# Patient Record
Sex: Female | Born: 1940 | Race: White | Hispanic: No | Smoking: Former smoker
Health system: Southern US, Community
[De-identification: ages and names within clinical notes are randomized; demographics above are authoritative.]

## PROBLEM LIST (undated history)

## (undated) DIAGNOSIS — K219 Gastro-esophageal reflux disease without esophagitis: Secondary | ICD-10-CM

## (undated) DIAGNOSIS — E039 Hypothyroidism, unspecified: Secondary | ICD-10-CM

## (undated) DIAGNOSIS — I10 Essential (primary) hypertension: Secondary | ICD-10-CM

## (undated) DIAGNOSIS — I509 Heart failure, unspecified: Secondary | ICD-10-CM

## (undated) HISTORY — PX: INTRACAPSULAR CATARACT EXTRACTION: SHX361

## (undated) HISTORY — PX: BACK SURGERY: SHX140

## (undated) HISTORY — PX: TUBAL LIGATION: SHX77

## (undated) HISTORY — DX: Heart failure, unspecified: I50.9

---

## 2002-03-24 ENCOUNTER — Encounter: Payer: Self-pay | Admitting: *Deleted

## 2002-03-24 ENCOUNTER — Ambulatory Visit (HOSPITAL_COMMUNITY): Admission: RE | Admit: 2002-03-24 | Discharge: 2002-03-24 | Payer: Self-pay | Admitting: *Deleted

## 2002-06-11 ENCOUNTER — Encounter: Payer: Self-pay | Admitting: Emergency Medicine

## 2002-06-11 ENCOUNTER — Encounter: Payer: Self-pay | Admitting: Internal Medicine

## 2002-06-11 ENCOUNTER — Inpatient Hospital Stay (HOSPITAL_COMMUNITY): Admission: EM | Admit: 2002-06-11 | Discharge: 2002-06-11 | Payer: Self-pay | Admitting: Internal Medicine

## 2002-08-10 ENCOUNTER — Encounter: Payer: Self-pay | Admitting: Family Medicine

## 2002-08-10 ENCOUNTER — Ambulatory Visit: Admission: RE | Admit: 2002-08-10 | Discharge: 2002-08-10 | Payer: Self-pay | Admitting: Family Medicine

## 2002-08-14 ENCOUNTER — Encounter: Payer: Self-pay | Admitting: Family Medicine

## 2002-08-14 ENCOUNTER — Ambulatory Visit (HOSPITAL_COMMUNITY): Admission: RE | Admit: 2002-08-14 | Discharge: 2002-08-14 | Payer: Self-pay | Admitting: Family Medicine

## 2003-09-01 ENCOUNTER — Other Ambulatory Visit: Admission: RE | Admit: 2003-09-01 | Discharge: 2003-09-01 | Payer: Self-pay | Admitting: Family Medicine

## 2003-09-20 ENCOUNTER — Ambulatory Visit (HOSPITAL_COMMUNITY): Admission: RE | Admit: 2003-09-20 | Discharge: 2003-09-20 | Payer: Self-pay | Admitting: Family Medicine

## 2004-05-26 ENCOUNTER — Ambulatory Visit (HOSPITAL_COMMUNITY): Admission: RE | Admit: 2004-05-26 | Discharge: 2004-05-26 | Payer: Self-pay | Admitting: Family Medicine

## 2004-09-20 ENCOUNTER — Ambulatory Visit (HOSPITAL_COMMUNITY): Admission: RE | Admit: 2004-09-20 | Discharge: 2004-09-20 | Payer: Self-pay | Admitting: Family Medicine

## 2004-10-05 ENCOUNTER — Other Ambulatory Visit: Admission: RE | Admit: 2004-10-05 | Discharge: 2004-10-05 | Payer: Self-pay | Admitting: Family Medicine

## 2004-10-24 ENCOUNTER — Ambulatory Visit (HOSPITAL_COMMUNITY): Admission: RE | Admit: 2004-10-24 | Discharge: 2004-10-24 | Payer: Self-pay | Admitting: Orthopedic Surgery

## 2005-09-25 ENCOUNTER — Ambulatory Visit (HOSPITAL_COMMUNITY): Admission: RE | Admit: 2005-09-25 | Discharge: 2005-09-25 | Payer: Self-pay | Admitting: Family Medicine

## 2005-10-10 ENCOUNTER — Other Ambulatory Visit: Admission: RE | Admit: 2005-10-10 | Discharge: 2005-10-10 | Payer: Self-pay | Admitting: Family Medicine

## 2006-12-10 ENCOUNTER — Ambulatory Visit (HOSPITAL_COMMUNITY): Admission: RE | Admit: 2006-12-10 | Discharge: 2006-12-10 | Payer: Self-pay | Admitting: Family Medicine

## 2006-12-11 ENCOUNTER — Other Ambulatory Visit: Admission: RE | Admit: 2006-12-11 | Discharge: 2006-12-11 | Payer: Self-pay | Admitting: Family Medicine

## 2008-06-29 ENCOUNTER — Ambulatory Visit (HOSPITAL_COMMUNITY): Admission: RE | Admit: 2008-06-29 | Discharge: 2008-06-29 | Payer: Self-pay | Admitting: Family Medicine

## 2008-07-20 ENCOUNTER — Other Ambulatory Visit: Admission: RE | Admit: 2008-07-20 | Discharge: 2008-07-20 | Payer: Self-pay | Admitting: Family Medicine

## 2010-05-19 NOTE — Discharge Summary (Signed)
Tabitha Wade, Tabitha NO.:  [id number]   MEDICAL RECORD NO.:  [id number]                   PATIENT TYPE:  INP   LOCATION:  2103                                 FACILITY:  [facility]   PHYSICIAN:  Deirdre Peer. Polite, M.D.              DATE OF BIRTH:  1940/11/25   DATE OF ADMISSION:  06/11/2002  DATE OF DISCHARGE:                                 DISCHARGE SUMMARY   DISCHARGE DIAGNOSES:  1. Sepsis, presumed secondary to urosepsis, but cannot exclude intra-     abdominal process versus osteo, apparently stable on intravenously     antibiotics, Cipro. No pressors at this time.  2. Critical anemia, status post transfusion. Admission hemoglobin 7.5,     discharge hemoglobin 10.0, status post two units of packed red blood     cells.  3. Postoperative hematomas left pelvis as well as posterior to the spine     with scattered calcified densities, most likely secondary to prior     surgery and ongoing Coumadin therapy for deep venous thrombosis     prophylaxis.  INR 2.1 currently being reversed with FFE and vitamin K.  4. Coagulopathy secondary to Coumadin therapy.  5. History of scoliosis, status post surgery, March of 2004 with repeat     surgery in June of 2004.  6. History of hepatitis.  7. History of rheumatoid arthritis.  8. History of gastroesophageal reflux disease and peptic ulcer disease.   DISCHARGE MEDICATIONS:  1. Cipro 400 mg IV q.12h.  2. Flagyl 500 mg IV q.8h.  3. Vancomycin 1 gram IV q.12h.  4. Protonix 40 mg IV daily.  5. OxyContin 20 mg q.12h.  6. Morphine sulfate 2 to 6 mg IV q.3-4h.  7. Pravachol 400 mg daily.  8. Neurontin 300 mg t.i.d.  9. Phenergan p.r.n.   STUDIES:  Admission hemoglobin 7.4, follow-up hemoglobin post transfusion  10.0.  CBC on admission also significant for white count of 21.9, 90%  neutrophils, platelets of 345, MCV 93.5.  PT 21.3, INR 2.1, PTT 29.  BMET;  sodium 133, potassium 3.4, chloride 99, CO2 31, BUN 3,  creatinine 0.8, AST  138, ALT 51.  UA; specific gravity 1.014, trace ketones, large blood,  nitrite negative, leukocyte positive, many bacteria.   Chest x-ray; no active disease.  CT of abdomen and pelvis; significant for  small bilateral pleural effusions with bibasilar atelectasis. Scoliosis rods  were noted.  There is a large area of soft tissue with posterior fat  measuring 13 cm in maximum transverse diameter and 5 mg in AP diameter.  Several gas bubbles within the soft tissue. Small bony fragments seen  extending throughout the soft tissue.  Liver and spleen were normal.  Kidneys unremarkable.  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  The pelvis revealed a large area of abnormal soft tissue in the left pelvis  compatible with a hematoma, hematoma cannot be excluded.  DISPOSITION:  The patient is being transferred to [facility] for further  follow-up and treatment to rule out possible infection in the spinal,  paraspinal area.   HISTORY OF PRESENT ILLNESS:  A 70 year old white female who recently was  discharged from [facility] after undergoing surgical procedure on her  spine, where she had an anterior diskectomy at the L4-5 and L5-S1 with  anterior fusion.  She also had posterior removal of segmental  instrumentation, posterior extension fusion of L3 to S1. The patient was  discharged on June 6, in stable condition, but since being home had poor  p.o. intake, nausea, abdominal pain, and significant back pain.  Prior to  her presenting to [facility] emergency department, the  patient had significant fever as well.  On presentation to the ED, the  patient was found to be febrile, temperature was 104, hypotensive with blood  pressure of 90/60, pulse 84, and respiratory rate of 12.  Labs revealed  abnormal UA consistent with UTI, elevated WBC count of 21.9, BMET within  normal limits.  Admission was deemed necessary for further evaluation and   treatment for possible sepsis.  Of note also, the patient has described  decreased ability to void since being at home with insertion of a Foley in  the ED, dark cloudy urine return.   PAST MEDICAL HISTORY:  As stated in the problem list.   MEDICATIONS:  As stated in the medication list.   SOCIAL HISTORY:  Negative for tobacco.   ALLERGIES:  IODINE which causes rash.  PENICILLIN.   HOSPITAL COURSE:  As stated, the patient was admitted to [facility] in septic condition, febrile and hypotensive. She was  resuscitated with aggressive IV fluids and given IV antibiotics in the form  of Cipro and Vancomycin. As stated, the patient was pan cultured. To date,  urine culture is significant for a UTI. Blood cultures are pending at this  time.  After repeat evaluation by myself, the patient was much more stable  hemodynamically and with decreasing temperature. Abdominal examination was  distended, very tender particularly in the left quadrant. As the patient  also had significant anemia on admission, it was felt that blood loss needed  to be ruled out.  CT scan was ordered which revealed large hematomas in the  left pelvis and posterior spine. The patient was subsequently transferred to  [facility] ICU as there were no ICU beds at [facility]. Surgery was consulted and it was recommended to have the patient's  surgeons at [facility] provide care for her as she was recently  discharged from the hospital.  General surgery is also called for backup  just in case the patient had problems with her hematomas.  Other stat lines  were ordered, ie, coags which revealed an INR of 2.1, most likely suggestive  of her Coumadin intake versus coagulopathy of sepsis.  At this time, the  patient is being prepared for transfer to [facility] after speaking with associates of Dr. Dois Davenport.  At this time, it is felt that she most likely is  uroseptic, but it is  impossible at this time to totally exclude infectious  process in her spine versus an infected hematoma.  We will continue broad  spectrum antibiotics and facilitate transfer to [facility] as well as  correction of coagulopathy.  Deirdre Peer. Polite, M.D.    RDP/MEDQ  D:  06/11/2002  T:  06/11/2002  Job:  [id number]   cc:   Dr. Dois Davenport, [facility] Fax [phone]

## 2011-06-26 ENCOUNTER — Other Ambulatory Visit (HOSPITAL_COMMUNITY): Payer: Self-pay | Admitting: Family Medicine

## 2011-06-26 DIAGNOSIS — Z1231 Encounter for screening mammogram for malignant neoplasm of breast: Secondary | ICD-10-CM

## 2011-07-19 ENCOUNTER — Ambulatory Visit (HOSPITAL_COMMUNITY)
Admission: RE | Admit: 2011-07-19 | Discharge: 2011-07-19 | Disposition: A | Discharge status: Home / Self Care | Payer: Medicare Other | Source: Ambulatory Visit | Attending: Family Medicine | Admitting: Family Medicine

## 2011-07-19 DIAGNOSIS — Z1231 Encounter for screening mammogram for malignant neoplasm of breast: Secondary | ICD-10-CM | POA: Insufficient documentation

## 2011-08-01 ENCOUNTER — Other Ambulatory Visit (HOSPITAL_COMMUNITY)
Admission: RE | Admit: 2011-08-01 | Discharge: 2011-08-01 | Disposition: A | Discharge status: Home / Self Care | Payer: Medicare Other | Source: Ambulatory Visit | Attending: Family Medicine | Admitting: Family Medicine

## 2011-08-01 ENCOUNTER — Other Ambulatory Visit: Payer: Self-pay | Admitting: Family Medicine

## 2011-08-01 DIAGNOSIS — Z124 Encounter for screening for malignant neoplasm of cervix: Secondary | ICD-10-CM | POA: Insufficient documentation

## 2011-08-07 ENCOUNTER — Other Ambulatory Visit: Payer: Self-pay | Admitting: Family Medicine

## 2011-08-07 ENCOUNTER — Other Ambulatory Visit (HOSPITAL_COMMUNITY): Payer: Self-pay | Admitting: Family Medicine

## 2011-08-07 DIAGNOSIS — R918 Other nonspecific abnormal finding of lung field: Secondary | ICD-10-CM

## 2011-08-07 DIAGNOSIS — M810 Age-related osteoporosis without current pathological fracture: Secondary | ICD-10-CM

## 2011-08-08 ENCOUNTER — Other Ambulatory Visit: Payer: Medicare Other

## 2011-08-09 ENCOUNTER — Ambulatory Visit
Admission: RE | Admit: 2011-08-09 | Discharge: 2011-08-09 | Disposition: A | Discharge status: Home / Self Care | Payer: Medicare Other | Source: Ambulatory Visit | Attending: Family Medicine | Admitting: Family Medicine

## 2011-08-09 DIAGNOSIS — R918 Other nonspecific abnormal finding of lung field: Secondary | ICD-10-CM

## 2011-08-09 MED ORDER — IOHEXOL 300 MG/ML  SOLN: 75.0000 mL | Freq: Once | INTRAMUSCULAR | Status: AC | PRN

## 2011-08-13 ENCOUNTER — Ambulatory Visit (HOSPITAL_COMMUNITY)
Admission: RE | Admit: 2011-08-13 | Discharge: 2011-08-13 | Disposition: A | Discharge status: Home / Self Care | Payer: Medicare Other | Source: Ambulatory Visit | Attending: Family Medicine | Admitting: Family Medicine

## 2011-08-13 DIAGNOSIS — Z780 Asymptomatic menopausal state: Secondary | ICD-10-CM | POA: Insufficient documentation

## 2011-08-13 DIAGNOSIS — Z13820 Encounter for screening for osteoporosis: Secondary | ICD-10-CM | POA: Insufficient documentation

## 2011-08-13 DIAGNOSIS — M810 Age-related osteoporosis without current pathological fracture: Secondary | ICD-10-CM

## 2012-03-24 ENCOUNTER — Other Ambulatory Visit: Payer: Self-pay | Admitting: Gastroenterology

## 2013-04-04 ENCOUNTER — Encounter: Payer: Self-pay | Admitting: *Deleted

## 2015-07-20 ENCOUNTER — Ambulatory Visit: Payer: No Typology Code available for payment source | Admitting: Cardiology

## 2015-08-29 ENCOUNTER — Ambulatory Visit (INDEPENDENT_AMBULATORY_CARE_PROVIDER_SITE_OTHER): Payer: Medicare Other | Admitting: Physician Assistant

## 2015-08-29 ENCOUNTER — Encounter: Payer: Self-pay | Admitting: Physician Assistant

## 2015-08-29 VITALS — BP 164/60 | HR 85 | Ht 63.0 in | Wt 126.6 lb

## 2015-08-29 DIAGNOSIS — E785 Hyperlipidemia, unspecified: Secondary | ICD-10-CM

## 2015-08-29 DIAGNOSIS — I10 Essential (primary) hypertension: Secondary | ICD-10-CM

## 2015-08-29 DIAGNOSIS — R011 Cardiac murmur, unspecified: Secondary | ICD-10-CM

## 2015-08-29 DIAGNOSIS — I6523 Occlusion and stenosis of bilateral carotid arteries: Secondary | ICD-10-CM

## 2015-08-29 NOTE — Patient Instructions (Signed)
Medication Instructions:  Continue current medications  Labwork: None Ordered  Testing/Procedures: Your physician has requested that you have an echocardiogram. Echocardiography is a painless test that uses sound waves to create images of your heart. It provides your doctor with information about the size and shape of your heart and how well your heart's chambers and valves are working. This procedure takes approximately one hour. There are no restrictions for this procedure.  Your physician has requested that you have a carotid duplex. This test is an ultrasound of the carotid arteries in your neck. It looks at blood flow through these arteries that supply the brain with blood. Allow one hour for this exam. There are no restrictions or special instructions.  Follow-Up: Your physician wants you to follow-up in: 1 Year with Dr Mayford Knife. You will receive a reminder letter in the mail two months in advance. If you don't receive a letter, please call our office to schedule the follow-up appointment.   Any Other Special Instructions Will Be Listed Below (If Applicable).   If you need a refill on your cardiac medications before your next appointment, please call your pharmacy.

## 2015-08-29 NOTE — Progress Notes (Signed)
Cardiology Office Note    Date:  08/29/2015   ID:  Wade, Tabitha 19-Sep-1940, MRN [id number]  PCP:  Tabitha Blamer, MD  Cardiologist: Dr. Mayford Knife  Chief Complaint  Patient presents with  . Follow-up    History of Present Illness:  Tabitha Wade is a 75 y.o. female with history of CHF seen by Dr. Mayford Knife in the remote past. She also has chronic back pain, hypertension, hyperlipidemia, hypothyroidism and GE reflux. She is referred to [place] today by Dr. Tiburcio Wade because of recent lower extremity edema. This occurred after a long flight from [place] where she lives part of the year. She was started on Lasix 20 mg daily.  Patient last saw Dr. Mayford Knife in 2013. She's had a nuclear stress test in the past that showed a very small anterior apical defect most likely breast attenuation 04/2010 for atypical chest pain. She also had palpitations and history of PACs and PVCs. History of carotid bruit with Doppler in 2012 showing 50-70% bilateral carotid stenosis.  Comes in today for f/u. Worried about her carotids that haven't been checked. BP up today but patient thinks it's from a headache she's had for the past 3 days. Very active, still drives, does her own housework, gardening, works at her church in [place]. Here to visit her daughter for a few months but primarily lives in [place] with her son. Had cataract surgery and lens transplant since she's been here in May. Denies chest pain, palpitations, dyspnea, dyspnea on exertion, dizziness or presyncope.  Past Medical History:  Diagnosis Date  . CHF (congestive heart failure) (HCC)     Past Surgical History:  Procedure Laterality Date  . BACK SURGERY     3 back surgery, 2004, 2006  . INTRACAPSULAR CATARACT EXTRACTION      Current Medications: Outpatient Medications Prior to Visit  Medication Sig Dispense Refill  . amLODipine (NORVASC) 5 MG tablet Take 5 mg by mouth daily.    Marland Kitchen aspirin EC 81 MG tablet Take 81 mg by mouth daily.    Marland Kitchen CALCIUM  PO Take 500 mg by mouth 3 (three) times daily.    . ergocalciferol (VITAMIN D2) 50000 units capsule Take 50,000 Units by mouth 2 (two) times a week.    . gabapentin (NEURONTIN) 300 MG capsule Take 300 mg by mouth 4 (four) times daily.    Marland Kitchen leflunomide (ARAVA) 10 MG tablet Take 10 mg by mouth daily.    Marland Kitchen levothyroxine (SYNTHROID, LEVOTHROID) 25 MCG tablet Take 25 mcg by mouth daily before breakfast.    . meloxicam (MOBIC) 7.5 MG tablet Take 7.5 mg by mouth daily.    . MORPHINE SULFATE PO Take 15 mg by mouth every 12 (twelve) hours as needed for severe pain (for pain).    . Multiple Vitamin (MULTI-VITAMIN PO) Take 1 tablet by mouth daily.    . ondansetron (ZOFRAN) 8 MG tablet Take 8 mg by mouth every 8 (eight) hours as needed for nausea or vomiting.    . pantoprazole (PROTONIX) 40 MG tablet Take 40 mg by mouth daily.    . Polyethylene Glycol 3350 (MIRALAX PO) Take 1 Container by mouth as needed (for constipation).    . predniSONE (DELTASONE) 5 MG tablet Take 0.5 tablet by mouth every other day    . rosuvastatin (CRESTOR) 20 MG tablet Take 20 mg by mouth daily.    Marland Kitchen sulfaSALAzine (AZULFIDINE) 500 MG tablet Take 500 mg by mouth every 6 (six) hours.    . traMADol Janean Sark)  50 MG tablet Take 50 mg by mouth every 6 (six) hours as needed (for pain).    . triamcinolone cream (KENALOG) 0.1 % Apply 1 application topically 2 (two) times daily. Take as needed at the sign of breakout    . ibandronate (BONIVA) 150 MG tablet Take 150 mg by mouth every 30 (thirty) days. Take in the morning with a full glass of water, on an empty stomach, and do not take anything else by mouth or lie down for the next 30 min.    Marland Kitchen METHOTREXATE SODIUM IJ Inject 25 mg into the vein once a week.     No facility-administered medications prior to visit.      Allergies:   Contrast media [iodinated diagnostic agents]; Cephalexin; Metoprolol tartrate; Neoporacin [bacitracin-neomycin-polymyxin]; Omnicef [cefdinir]; and Penicillins    Social History   Social History  . Marital status: Widowed    Spouse name: [name]  . Number of children: [name]  . Years of education: [name]   Social History Main Topics  . Smoking status: Former Smoker    Quit date: 08/29/1958  . Smokeless tobacco: Former Neurosurgeon  . Alcohol use No  . Drug use: No  . Sexual activity: Not Asked   Other Topics Concern  . None   Social History Narrative  . None     Family History:  The patient's   family history includes Heart attack in her father and mother; Heart disease in her sister and sister; Stroke in her sister.   ROS:   Please see the history of present illness.    Review of Systems  Constitution: Negative.  HENT: Negative.   Eyes: Negative.   Cardiovascular: Negative.   Respiratory: Negative.   Hematologic/Lymphatic: Negative.   Musculoskeletal: Positive for stiffness. Negative for joint pain.  Gastrointestinal: Negative.   Genitourinary: Negative.   Neurological: Negative.    All other systems reviewed and are negative.   PHYSICAL EXAM:   VS:  BP (!) 164/60   Pulse 85   Ht 5\' 3"  (1.6 m)   Wt 126 lb 9.6 oz (57.4 kg)   SpO2 95%   BMI 22.43 kg/m   Physical Exam  GEN: Well nourished, well developed, in no acute distress  Neck: Bilateral carotid bruits no JVD,  or masses Cardiac:RRR; 2/6 systolic murmur at the left sternal border and apex, no rubs, or gallops  Respiratory:  clear to auscultation bilaterally, normal work of breathing GI: soft, nontender, nondistended, + BS Ext: without cyanosis, clubbing, or edema, Good distal pulses bilaterally MS: no deformity or atrophy  Skin: warm and dry, no rash Psych: euthymic mood, full affect  Wt Readings from Last 3 Encounters:  08/29/15 126 lb 9.6 oz (57.4 kg)      Studies/Labs Reviewed:   EKG:  EKG is  ordered today.  The ekg ordered today demonstrates  Normal sinus rhythm, normal EKG  Recent Labs: No results found for requested labs within last 8760 hours.   Lipid  Panel No results found for: CHOL, TRIG, HDL, CHOLHDL, VLDL, LDLCALC, LDLDIRECT  Additional studies/ records that were reviewed today include:  Records reviewed from Dr. Johnathan Hausen office please see above dictation they will be scanned into the chart.    ASSESSMENT:    1. Murmur   2. Carotid stenosis, bilateral   3. Essential hypertension   4. Hyperlipemia      PLAN:  In order of problems listed above:  Systolic murmur and history of diastolic heart failure check 2-D echo. Follow-up  with Dr. Mayford Knife in one year or sooner depending on test results.  Carotid stenosis 50-70% in 2012 hasn't had carotid Dopplers checked since then. We'll check  Essential hypertension blood pressure elevated today. Patient usually checks it at home and it's stable. She's had a headache the past few days and thinks this is why it was high. She also had salty food last night. She will check her blood pressures at home and call us if they remain elevated.  Hyperlipidemia managed by primary care in [place] on Crestor    Medication Adjustments/Labs and Tests Ordered: Current medicines are reviewed at length with the patient today.  Concerns regarding medicines are outlined above.  Medication changes, Labs and Tests ordered today are listed in the Patient Instructions below. Patient Instructions  Medication Instructions:  Continue current medications  Labwork: None Ordered  Testing/Procedures: Your physician has requested that you have an echocardiogram. Echocardiography is a painless test that uses sound waves to create images of your heart. It provides your doctor with information about the size and shape of your heart and how well your heart's chambers and valves are working. This procedure takes approximately one hour. There are no restrictions for this procedure.  Your physician has requested that you have a carotid duplex. This test is an ultrasound of the carotid arteries in your neck. It looks at  blood flow through these arteries that supply the brain with blood. Allow one hour for this exam. There are no restrictions or special instructions.  Follow-Up: Your physician wants you to follow-up in: 1 Year with Dr Mayford Knife. You will receive a reminder letter in the mail two months in advance. If you don't receive a letter, please call our office to schedule the follow-up appointment.   Any Other Special Instructions Will Be Listed Below (If Applicable).   If you need a refill on your cardiac medications before your next appointment, please call your pharmacy.      Elson Clan, PA-C  08/29/2015 12:55 PM    [facility] [address] [place], [place], [place]  [address] Phone: [phone]; Fax: [phone]

## 2015-09-13 ENCOUNTER — Other Ambulatory Visit: Payer: Self-pay

## 2015-09-13 ENCOUNTER — Ambulatory Visit (HOSPITAL_COMMUNITY)
Admission: RE | Admit: 2015-09-13 | Discharge: 2015-09-13 | Disposition: A | Discharge status: Home / Self Care | Payer: Medicare Other | Source: Ambulatory Visit | Attending: Cardiovascular Disease | Admitting: Cardiovascular Disease

## 2015-09-13 ENCOUNTER — Ambulatory Visit (HOSPITAL_BASED_OUTPATIENT_CLINIC_OR_DEPARTMENT_OTHER): Payer: Medicare Other

## 2015-09-13 DIAGNOSIS — I509 Heart failure, unspecified: Secondary | ICD-10-CM | POA: Diagnosis not present

## 2015-09-13 DIAGNOSIS — R011 Cardiac murmur, unspecified: Secondary | ICD-10-CM | POA: Diagnosis not present

## 2015-09-13 DIAGNOSIS — E785 Hyperlipidemia, unspecified: Secondary | ICD-10-CM | POA: Insufficient documentation

## 2015-09-13 DIAGNOSIS — I340 Nonrheumatic mitral (valve) insufficiency: Secondary | ICD-10-CM | POA: Diagnosis not present

## 2015-09-13 DIAGNOSIS — I071 Rheumatic tricuspid insufficiency: Secondary | ICD-10-CM | POA: Insufficient documentation

## 2015-09-13 DIAGNOSIS — Z87891 Personal history of nicotine dependence: Secondary | ICD-10-CM | POA: Insufficient documentation

## 2015-09-13 DIAGNOSIS — I110 Hypertensive heart disease with heart failure: Secondary | ICD-10-CM | POA: Insufficient documentation

## 2015-09-13 DIAGNOSIS — I6523 Occlusion and stenosis of bilateral carotid arteries: Secondary | ICD-10-CM | POA: Insufficient documentation

## 2015-09-22 ENCOUNTER — Telehealth: Payer: Self-pay | Admitting: *Deleted

## 2015-09-22 DIAGNOSIS — Z5181 Encounter for therapeutic drug level monitoring: Secondary | ICD-10-CM

## 2015-09-22 DIAGNOSIS — Z79899 Other long term (current) drug therapy: Principal | ICD-10-CM

## 2015-09-22 MED ORDER — FUROSEMIDE 20 MG PO TABS: 20.0000 mg | ORAL_TABLET | Freq: Every day | ORAL | 1 refills | Status: DC

## 2015-09-22 NOTE — Telephone Encounter (Signed)
Pt aware of her lab echo results and is agreeable with restarting Lasix 20 mg qd and repeat bmet 10/10/15. Order in EPIC. Pt verbalized understanding.

## 2015-09-22 NOTE — Telephone Encounter (Signed)
-----   Message from Dyann Kief, PA-C sent at 09/21/2015  7:50 AM EDT ----- Give Lasix 20 mg once daily. Advise to eat potassium rich foods such as bananas orange juice, broccoli. Should have bmet checked about 2-3 weeks after starting lasix

## 2015-10-10 ENCOUNTER — Other Ambulatory Visit: Payer: Medicare Other | Admitting: *Deleted

## 2015-10-10 DIAGNOSIS — Z5181 Encounter for therapeutic drug level monitoring: Secondary | ICD-10-CM

## 2015-10-10 DIAGNOSIS — Z79899 Other long term (current) drug therapy: Principal | ICD-10-CM

## 2015-10-10 LAB — BASIC METABOLIC PANEL
BUN: 12 mg/dL (ref 7–25)
CO2: 29 mmol/L (ref 20–31)
CREATININE: 0.55 mg/dL — AB (ref 0.60–0.93)
Calcium: 9.1 mg/dL (ref 8.6–10.4)
Chloride: 99 mmol/L (ref 98–110)
Glucose, Bld: 88 mg/dL (ref 65–99)
Potassium: 4.5 mmol/L (ref 3.5–5.3)
Sodium: 138 mmol/L (ref 135–146)

## 2015-10-11 ENCOUNTER — Encounter: Payer: Self-pay | Admitting: Physician Assistant

## 2015-10-11 ENCOUNTER — Ambulatory Visit (INDEPENDENT_AMBULATORY_CARE_PROVIDER_SITE_OTHER): Payer: Medicare Other | Admitting: Physician Assistant

## 2015-10-11 ENCOUNTER — Encounter (INDEPENDENT_AMBULATORY_CARE_PROVIDER_SITE_OTHER): Payer: Self-pay

## 2015-10-11 VITALS — BP 172/80 | HR 80 | Ht 63.0 in | Wt 135.0 lb

## 2015-10-11 DIAGNOSIS — Z5181 Encounter for therapeutic drug level monitoring: Secondary | ICD-10-CM | POA: Diagnosis not present

## 2015-10-11 DIAGNOSIS — R011 Cardiac murmur, unspecified: Secondary | ICD-10-CM

## 2015-10-11 DIAGNOSIS — I6523 Occlusion and stenosis of bilateral carotid arteries: Secondary | ICD-10-CM

## 2015-10-11 DIAGNOSIS — I10 Essential (primary) hypertension: Secondary | ICD-10-CM

## 2015-10-11 DIAGNOSIS — I5032 Chronic diastolic (congestive) heart failure: Secondary | ICD-10-CM

## 2015-10-11 DIAGNOSIS — Z79899 Other long term (current) drug therapy: Secondary | ICD-10-CM

## 2015-10-11 DIAGNOSIS — M1990 Unspecified osteoarthritis, unspecified site: Secondary | ICD-10-CM

## 2015-10-11 MED ORDER — ENALAPRIL MALEATE 5 MG PO TABS: 5.0000 mg | ORAL_TABLET | Freq: Every day | ORAL | 1 refills | Status: DC

## 2015-10-11 NOTE — Progress Notes (Signed)
Cardiology Office Note    Date:  10/11/2015   ID:  Junnie, Loschiavo April 10, 1940, MRN [id number]  PCP:  Johny Blamer, MD  Cardiologist: Dr. Mayford Knife  Chief Complaint  Patient presents with  . Pre-op Exam    History of Present Illness:  Tabitha Wade is a 75 y.o. female  with history of CHF seen by Dr. Mayford Knife in the remote past. She also has chronic back pain, hypertension, hyperlipidemia, hypothyroidism and GE reflux. She is referred to [place] today by Dr. Tiburcio Pea because of recent lower extremity edema. This occurred after a long flight from [place] where she lives part of the year. She was started on Lasix 20 mg daily.   Patient last saw Dr. Mayford Knife in 2013. She's had a nuclear stress test in the past that showed a very small anterior apical defect most likely breast attenuation 04/2010 for atypical chest pain. She also had palpitations and history of PACs and PVCs. History of carotid bruit with Doppler in 2012 showing 50-70% bilateral carotid stenosis.  I saw her in follow-up 08/29/15. Blood pressure was up that day but she thought was due to headache. She primarily lives in [place] with her some but is here visiting her daughter for a few months.   2-D echo showed normal LVEF 65-70% with grade 1 DD and mild to moderately increased PA pressure 45 mmHg. She had trace of MR and TR. I prescribed Lasix 20 mg daily for edema. Carotid Dopplers showed 1-39% right ICA stenosis and 60-79% left ICA stenosis.  Patient comes in today and wanted to review her tests. She also wants to know she should take Lasix every day. Her blood pressure is up once again today. She said she hasn't taken her afternoon pain medication. She hasn't been checking her blood pressure at home but will start.She used to take enalapril for her hypertension and said it was really well-controlled on this medicine. Her primary care doctor decided to switch her to Norvasc so she wouldn't be on the same drug for too many years. She  also has occasional palpitations but not enough to wear a monitor according to her.     Past Medical History:  Diagnosis Date  . CHF (congestive heart failure) (HCC)     Past Surgical History:  Procedure Laterality Date  . BACK SURGERY     3 back surgery, 2004, 2006  . INTRACAPSULAR CATARACT EXTRACTION      Current Medications: Outpatient Medications Prior to Visit  Medication Sig Dispense Refill  . amLODipine (NORVASC) 5 MG tablet Take 5 mg by mouth daily.    Marland Kitchen aspirin EC 81 MG tablet Take 81 mg by mouth daily.    Marland Kitchen CALCIUM PO Take 500 mg by mouth 3 (three) times daily.    . ergocalciferol (VITAMIN D2) 50000 units capsule Take 50,000 Units by mouth 2 (two) times a week.    . furosemide (LASIX) 20 MG tablet Take 1 tablet (20 mg total) by mouth daily. 90 tablet 1  . gabapentin (NEURONTIN) 300 MG capsule Take 300 mg by mouth 4 (four) times daily.    Marland Kitchen ibandronate (BONIVA) 150 MG tablet Take 150 mg by mouth every 30 (thirty) days. Take in the morning with a full glass of water, on an empty stomach, and do not take anything else by mouth or lie down for the next 30 min.    Marland Kitchen leflunomide (ARAVA) 10 MG tablet Take 10 mg by mouth daily.    Marland Kitchen levothyroxine (SYNTHROID,  LEVOTHROID) 25 MCG tablet Take 25 mcg by mouth daily before breakfast.    . meloxicam (MOBIC) 7.5 MG tablet Take 7.5 mg by mouth daily.    Marland Kitchen METHOTREXATE SODIUM IJ Inject 25 mg into the vein once a week.    . MORPHINE SULFATE PO Take 15 mg by mouth every 12 (twelve) hours as needed for severe pain (for pain).    . Multiple Vitamin (MULTI-VITAMIN PO) Take 1 tablet by mouth daily.    . ondansetron (ZOFRAN) 8 MG tablet Take 8 mg by mouth every 8 (eight) hours as needed for nausea or vomiting.    . pantoprazole (PROTONIX) 40 MG tablet Take 40 mg by mouth daily.    . Polyethylene Glycol 3350 (MIRALAX PO) Take 1 Container by mouth as needed (for constipation).    . predniSONE (DELTASONE) 5 MG tablet Take 0.5 tablet by mouth every  other day    . rosuvastatin (CRESTOR) 20 MG tablet Take 20 mg by mouth daily.    Marland Kitchen sulfaSALAzine (AZULFIDINE) 500 MG tablet Take 500 mg by mouth every 6 (six) hours.    . traMADol (ULTRAM) 50 MG tablet Take 50 mg by mouth every 6 (six) hours as needed (for pain).    . triamcinolone cream (KENALOG) 0.1 % Apply 1 application topically 2 (two) times daily. Take as needed at the sign of breakout     No facility-administered medications prior to visit.      Allergies:   Contrast media [iodinated diagnostic agents]; Cephalexin; Metoprolol tartrate; Neoporacin [bacitracin-neomycin-polymyxin]; Omnicef [cefdinir]; and Penicillins   Social History   Social History  . Marital status: Widowed    Spouse name: [name]  . Number of children: [name]  . Years of education: [name]   Social History Main Topics  . Smoking status: Former Smoker    Quit date: 08/29/1958  . Smokeless tobacco: Former Neurosurgeon  . Alcohol use No  . Drug use: No  . Sexual activity: Not Asked   Other Topics Concern  . None   Social History Narrative  . None     Family History:  The patient's family history includes Heart attack in her father and mother; Heart disease in her sister and sister; Stroke in her sister.   ROS:   Please see the history of present illness.    Review of Systems  Constitution: Positive for malaise/fatigue.  HENT: Negative.   Eyes: Negative.   Cardiovascular: Negative.   Respiratory: Negative.   Hematologic/Lymphatic: Bruises/bleeds easily.  Musculoskeletal: Positive for arthritis, back pain and myalgias. Negative for joint pain.       Chronic leg pain  Gastrointestinal: Negative.   Genitourinary: Negative.   Neurological: Negative.    All other systems reviewed and are negative.   PHYSICAL EXAM:   VS:  BP (!) 172/80 (BP Location: Right Arm, Patient Position: Sitting, Cuff Size: Normal)   Pulse 80   Ht 5\' 3"  (1.6 m)   Wt 135 lb (61.2 kg)   BMI 23.91 kg/m   Physical Exam  GEN: Well nourished,  well developed, in no acute distress Neck: no JVD, carotid bruits, or masses Cardiac:RRR; 2/6 systolic murmur at the left sternal border, positive S4, no rubs Respiratory:  clear to auscultation bilaterally, normal work of breathing GI: soft, nontender, nondistended, + BS Ext: without cyanosis, clubbing, or edema, Good distal pulses bilaterally MS: no deformity or atrophy Skin: warm and dry, no rash Psych: euthymic mood, full affect  Wt Readings from Last 3 Encounters:  10/11/15  135 lb (61.2 kg)  08/29/15 126 lb 9.6 oz (57.4 kg)      Studies/Labs Reviewed:   EKG:  EKG isNot ordered today.   Recent Labs: 10/10/2015: BUN 12; Creat 0.55; Potassium 4.5; Sodium 138   Lipid Panel No results found for: CHOL, TRIG, HDL, CHOLHDL, VLDL, LDLCALC, LDLDIRECT  Additional studies/ records that were reviewed today include:  Carotid Dopplers 09/2015 Carotids okay will need follow-up Dopplers in 1 year. 1-39% right ICA stenosis.60-79% left ICA stenosis        2-D echo 09/13/15 Study Conclusions   - Left ventricle: The cavity size was normal. Wall thickness was   normal. Systolic function was vigorous. The estimated ejection   fraction was in the range of 65% to 70%. Wall motion was normal;   there were no regional wall motion abnormalities. Doppler   parameters are consistent with abnormal left ventricular   relaxation (grade 1 diastolic dysfunction). - Pulmonary arteries: Systolic pressure was mildly to moderately   increased. PA peak pressure: 45 mm Hg (S).   Impressions:   - Vigorous LV systolic function; grade 1 diastolic dysfunction;   trace MR and TR; mild to moderate elevation in pulmonary   pressure.    ASSESSMENT:    1. Encounter for monitoring diuretic therapy   2. Chronic diastolic CHF (congestive heart failure) (HCC)   3. Carotid stenosis, bilateral   4. Essential hypertension   5. Murmur      PLAN:  In order of problems listed above:  Encounter for monitoring  diuretic therapy: With elevated blood pressures and pulmonary pressures on echo would recommend continuing Lasix 20 mg daily at this time  Chronic diastolic heart failure compensated  Bilateral carotid stenosis with recent Dopplers listed above. 60-79% LICA stenosis. Will need repeat in one year.  Essential hypertension blood pressure is once again elevated here today. Patient attributes this to pain. Because of her recent echo results and elevated blood pressures she will start checking them daily. Restart enalapril 5 mg once daily. Continue amlodipine at this time. I will see her back in several weeks to see if any further adjustments are needed.  Heart murmur patient's 2-D echo reviewed with her in detail. She only has a trace of MR and TR    Medication Adjustments/Labs and Tests Ordered: Current medicines are reviewed at length with the patient today.  Concerns regarding medicines are outlined above.  Medication changes, Labs and Tests ordered today are listed in the Patient Instructions below. Patient Instructions  Medication Instructions:  Start enalapril 5mg  daily  Labwork: none  Testing/Procedures: None   Follow-Up: Your physician recommends that you schedule a follow-up appointment in: 3 weeks with , PA or Dr Jacolyn Reedy.        If you need a refill on your cardiac medications before your next appointment, please call your pharmacy.      Signed, Mayford Knife, PA-C  10/11/2015 2:56 PM    [facility] [address] [place], [place], [place]  [place] Phone: [phone]; Fax: [phone]

## 2015-10-11 NOTE — Patient Instructions (Signed)
Medication Instructions:  Start enalapril 5mg  daily  Labwork: none  Testing/Procedures: None   Follow-Up: Your physician recommends that you schedule a follow-up appointment in: 3 weeks with , PA or Dr Jacolyn Reedy.        If you need a refill on your cardiac medications before your next appointment, please call your pharmacy.

## 2015-11-01 ENCOUNTER — Ambulatory Visit (INDEPENDENT_AMBULATORY_CARE_PROVIDER_SITE_OTHER): Payer: Medicare Other | Admitting: Physician Assistant

## 2015-11-01 ENCOUNTER — Encounter: Payer: Self-pay | Admitting: Physician Assistant

## 2015-11-01 VITALS — BP 142/56 | HR 83 | Ht 62.5 in | Wt 139.4 lb

## 2015-11-01 DIAGNOSIS — I10 Essential (primary) hypertension: Secondary | ICD-10-CM | POA: Diagnosis not present

## 2015-11-01 DIAGNOSIS — J069 Acute upper respiratory infection, unspecified: Secondary | ICD-10-CM

## 2015-11-01 DIAGNOSIS — R011 Cardiac murmur, unspecified: Secondary | ICD-10-CM

## 2015-11-01 DIAGNOSIS — I6523 Occlusion and stenosis of bilateral carotid arteries: Secondary | ICD-10-CM

## 2015-11-01 DIAGNOSIS — I5032 Chronic diastolic (congestive) heart failure: Secondary | ICD-10-CM | POA: Diagnosis not present

## 2015-11-01 NOTE — Patient Instructions (Addendum)
Medication Instructions:  STOP AMLODIPINE  START ENALAPRIL 5 MG DAILY AS PREVIOUSLY RECOMMENDED  Labwork: NONE  Testing/Procedures: NONE  Follow-Up: Your physician wants you to follow-up in: 1 YEAR WITH DR Mayford Knife You will receive a reminder letter in the mail two months in advance. If you don't receive a letter, please call our office to schedule the follow-up appointment.  Any Other Special Instructions Will Be Listed Below (If Applicable). SEE DR Tiburcio Pea FOR YOUR CONGESTION/COUGH   MONITOR YOUR BLOOD PRESSURES AT HOME AND CALL WITH UPDATE [phone]  If you need a refill on your cardiac medications before your next appointment, please call your pharmacy.

## 2015-11-01 NOTE — Progress Notes (Signed)
Cardiology Office Note    Date:  11/01/2015   ID:  Tabitha, Wade 05/12/1940, MRN [id number]  PCP:  Johny Blamer, MD  Cardiologist: Dr. Mayford Knife  Chief Complaint  Patient presents with  . Follow-up  . Chest Pain    pt states some due to coughing   . Shortness of Breath    no SOB     History of Present Illness:  Tabitha Wade is a 75 y.o. female with history of CHF seen by Dr. Mayford Knife in the remote past. She also has chronic back pain, hypertension, hyperlipidemia, hypothyroidism and GE reflux. She is referred to [place] today by Dr. Tiburcio Pea because of recent lower extremity edema. This occurred after a long flight from [place] where she lives part of the year. She was started on Lasix 20 mg daily.   Patient last saw Dr. Mayford Knife in 2013. She's had a nuclear stress test in the past that showed a very small anterior apical defect most likely breast attenuation 04/2010 for atypical chest pain. She also had palpitations and history of PACs and PVCs. History of carotid bruit with Doppler in 2012 showing 50-70% bilateral carotid stenosis.   I saw her in follow-up 08/29/15. Blood pressure was up that day but she thought was due to headache. She primarily lives in [place] with her some but is here visiting her daughter for a few months.   2-D echo showed normal LVEF 65-70% with grade 1 DD and mild to moderately increased PA pressure 45 mmHg. She had trace of MR and TR. I prescribed Lasix 20 mg daily for edema. Carotid Dopplers showed 1-39% right ICA stenosis and 60-79% left ICA stenosis.   I saw her 10/11/15 to review her tests. Her blood pressure was up again. She claimed it was because she hadn't taken her afternoon pain medications. I restarted enalapril 5 mg once daily, continued Lasix 20 mg daily and amlodipine.  She comes in today with blood pressure readings that she has taken and they are all pretty normal. There were some size 148 systolic but she says it happens when she is under a lot  of stress or in pain. She never did start taking the enalapril. She would like to stop amlodipine and go back on enalapril for blood pressure. I told her this would be fine as long as she keeps a close eye on her blood pressures. She also has a cough with thick yellow sputum production. She hasn't had time to see her primary care but was going to call today. No fever or chills.    Past Medical History:  Diagnosis Date  . CHF (congestive heart failure) (HCC)     Past Surgical History:  Procedure Laterality Date  . BACK SURGERY     3 back surgery, 2004, 2006  . INTRACAPSULAR CATARACT EXTRACTION      Current Medications: Outpatient Medications Prior to Visit  Medication Sig Dispense Refill  . aspirin EC 81 MG tablet Take 81 mg by mouth daily.    Marland Kitchen CALCIUM PO Take 500 mg by mouth 3 (three) times daily.    . enalapril (VASOTEC) 5 MG tablet Take 1 tablet (5 mg total) by mouth daily. 30 tablet 1  . ergocalciferol (VITAMIN D2) 50000 units capsule Take 50,000 Units by mouth 2 (two) times a week.    . furosemide (LASIX) 20 MG tablet Take 1 tablet (20 mg total) by mouth daily. 90 tablet 1  . gabapentin (NEURONTIN) 300 MG capsule Take 300 mg  by mouth 4 (four) times daily.    Marland Kitchen ibandronate (BONIVA) 150 MG tablet Take 150 mg by mouth every 30 (thirty) days. Take in the morning with a full glass of water, on an empty stomach, and do not take anything else by mouth or lie down for the next 30 min.    Marland Kitchen leflunomide (ARAVA) 10 MG tablet Take 10 mg by mouth daily.    Marland Kitchen levothyroxine (SYNTHROID, LEVOTHROID) 25 MCG tablet Take 25 mcg by mouth daily before breakfast.    . meloxicam (MOBIC) 7.5 MG tablet Take 7.5 mg by mouth daily.    Marland Kitchen METHOTREXATE SODIUM IJ Inject 25 mg into the vein once a week.    . MORPHINE SULFATE PO Take 15 mg by mouth every 12 (twelve) hours as needed for severe pain (for pain).    . Multiple Vitamin (MULTI-VITAMIN PO) Take 1 tablet by mouth daily.    . pantoprazole (PROTONIX) 40 MG  tablet Take 40 mg by mouth daily.    . Polyethylene Glycol 3350 (MIRALAX PO) Take 1 Container by mouth as needed (for constipation).    . predniSONE (DELTASONE) 5 MG tablet Take 0.5 tablet by mouth every other day    . rosuvastatin (CRESTOR) 20 MG tablet Take 20 mg by mouth daily.    Marland Kitchen sulfaSALAzine (AZULFIDINE) 500 MG tablet Take 500 mg by mouth every 6 (six) hours.    . traMADol (ULTRAM) 50 MG tablet Take 50 mg by mouth every 6 (six) hours as needed (for pain).    . triamcinolone cream (KENALOG) 0.1 % Apply 1 application topically 2 (two) times daily. Take as needed at the sign of breakout    . amLODipine (NORVASC) 5 MG tablet Take 5 mg by mouth daily.    . ondansetron (ZOFRAN) 8 MG tablet Take 8 mg by mouth every 8 (eight) hours as needed for nausea or vomiting.     No facility-administered medications prior to visit.      Allergies:   Contrast media [iodinated diagnostic agents]; Cephalexin; Metoprolol tartrate; Neoporacin [bacitracin-neomycin-polymyxin]; Omnicef [cefdinir]; and Penicillins   Social History   Social History  . Marital status: Widowed    Spouse name: [name]  . Number of children: [name]  . Years of education: [name]   Social History Main Topics  . Smoking status: Former Smoker    Quit date: 08/29/1958  . Smokeless tobacco: Former Neurosurgeon  . Alcohol use No  . Drug use: No  . Sexual activity: Not Asked   Other Topics Concern  . None   Social History Narrative  . None     Family History:  The patient's family history includes Heart attack in her father and mother; Heart disease in her sister and sister; Stroke in her sister.   ROS:   Please see the history of present illness.    Review of Systems  Constitution: Negative.  HENT: Negative.   Eyes: Negative.   Cardiovascular: Negative.   Respiratory: Positive for cough and sputum production.   Hematologic/Lymphatic: Negative.   Musculoskeletal: Positive for arthritis, back pain, muscle weakness and myalgias. Negative  for joint pain.  Gastrointestinal: Negative.   Genitourinary: Negative.   Neurological: Negative.    All other systems reviewed and are negative.   PHYSICAL EXAM:   VS:  BP (!) 142/56   Pulse 83   Ht 5' 2.5" (1.588 m)   Wt 139 lb 6.4 oz (63.2 kg)   SpO2 97%   BMI 25.09 kg/m   Physical  Exam  GEN: Well nourished, well developed, in no acute distress Neck: no JVD, carotid bruits, or masses Cardiac:RRR; 2/6 systolic murmur at the left sternal border, no rubs, or gallops  Respiratory:  Decreased breath sounds with diffuse wheezing throughout  GI: soft, nontender, nondistended, + BS Ext: without cyanosis, clubbing, or edema, Good distal pulses bilaterally MS: no deformity or atrophy Skin: warm and dry, no rash Psych: euthymic mood, full affect  Wt Readings from Last 3 Encounters:  11/01/15 139 lb 6.4 oz (63.2 kg)  10/11/15 135 lb (61.2 kg)  08/29/15 126 lb 9.6 oz (57.4 kg)      Studies/Labs Reviewed:   EKG:  EKG is ordered today.    Recent Labs: 10/10/2015: BUN 12; Creat 0.55; Potassium 4.5; Sodium 138   Lipid Panel No results found for: CHOL, TRIG, HDL, CHOLHDL, VLDL, LDLCALC, LDLDIRECT  Additional studies/ records that were reviewed today include:  Carotid Dopplers 09/2015    Carotids okay will need follow-up Dopplers in 1 year. 1-39% right ICA stenosis.60-79% left ICA stenosis            2-D echo 09/13/15 Study Conclusions   - Left ventricle: The cavity size was normal. Wall thickness was   normal. Systolic function was vigorous. The estimated ejection   fraction was in the range of 65% to 70%. Wall motion was normal;   there were no regional wall motion abnormalities. Doppler   parameters are consistent with abnormal left ventricular   relaxation (grade 1 diastolic dysfunction). - Pulmonary arteries: Systolic pressure was mildly to moderately   increased. PA peak pressure: 45 mm Hg (S).   Impressions:   - Vigorous LV systolic function; grade 1 diastolic  dysfunction;   trace MR and TR; mild to moderate elevation in pulmonary   pressure.     ASSESSMENT:    1. Essential hypertension   2. Chronic diastolic CHF (congestive heart failure) (HCC)   3. Murmur   4. Upper respiratory tract infection, unspecified type      PLAN:  In order of problems listed above:  Essential hypertension: Patient's blood pressure readings from home actually looked good and she has not started the enalapril. She would like to start this and stopped the amlodipine which I told her was fine as long as she kept a close eye on her blood pressure readings. She is getting ready to go back to her home in [place] and will follow-up there.  Chronic diastolic CHF well compensated  Heart murmur with trace of MR and TR on echo  Upper respiratory tract infection, I suspect is bronchitis. She is already using Mucinex and drinking hot tea. She is calling Dr. Tiburcio Pea today for an appointment.     Medication Adjustments/Labs and Tests Ordered: Current medicines are reviewed at length with the patient today.  Concerns regarding medicines are outlined above.  Medication changes, Labs and Tests ordered today are listed in the Patient Instructions below. Patient Instructions  Medication Instructions:  STOP AMLODIPINE  START ENALAPRIL 5 MG DAILY AS PREVIOUSLY RECOMMENDED  Labwork: NONE  Testing/Procedures: NONE  Follow-Up: Your physician wants you to follow-up in: 1 YEAR WITH DR Mayford Knife You will receive a reminder letter in the mail two months in advance. If you don't receive a letter, please call our office to schedule the follow-up appointment.  Any Other Special Instructions Will Be Listed Below (If Applicable). SEE DR Tiburcio Pea FOR YOUR CONGESTION/COUGH   MONITOR YOUR BLOOD PRESSURES AT HOME AND CALL WITH UPDATE [phone]  If you need a refill on your cardiac medications before your next appointment, please call your pharmacy.     Signed, Jacolyn Reedy, PA-C    11/01/2015 11:14 AM    [facility] [address] [place], [place], [place]  [address] Phone: [phone]; Fax: [phone]

## 2016-10-22 ENCOUNTER — Other Ambulatory Visit: Payer: Self-pay | Admitting: *Deleted

## 2016-10-22 DIAGNOSIS — I6523 Occlusion and stenosis of bilateral carotid arteries: Secondary | ICD-10-CM

## 2017-09-09 ENCOUNTER — Encounter (HOSPITAL_COMMUNITY): Payer: Medicare Other

## 2019-02-11 ENCOUNTER — Encounter: Payer: Self-pay | Admitting: Cardiology

## 2019-05-28 ENCOUNTER — Ambulatory Visit (INDEPENDENT_AMBULATORY_CARE_PROVIDER_SITE_OTHER): Payer: Medicare Other | Admitting: Cardiology

## 2019-05-28 VITALS — BP 180/60 | HR 72 | Ht 62.0 in | Wt 113.6 lb

## 2019-05-28 DIAGNOSIS — R42 Dizziness and giddiness: Secondary | ICD-10-CM

## 2019-05-28 DIAGNOSIS — I6522 Occlusion and stenosis of left carotid artery: Secondary | ICD-10-CM | POA: Diagnosis not present

## 2019-05-28 DIAGNOSIS — I5032 Chronic diastolic (congestive) heart failure: Secondary | ICD-10-CM | POA: Diagnosis not present

## 2019-05-28 DIAGNOSIS — I10 Essential (primary) hypertension: Secondary | ICD-10-CM | POA: Diagnosis not present

## 2019-05-28 MED ORDER — AMLODIPINE BESYLATE 5 MG PO TABS: 5.0000 mg | ORAL_TABLET | Freq: Every day | ORAL | 3 refills | Status: DC

## 2019-05-28 NOTE — Progress Notes (Signed)
Cardiology Office Note:    Date:  05/29/2019   ID:  Tabitha, Wade Aug 11, 1940, MRN [id number]  PCP:  Johny Blamer, MD  Cardiologist:  No primary care provider on file.  Electrophysiologist:  None   Referring MD: Johny Blamer, MD   No chief complaint on file.   History of Present Illness:    Tabitha Wade is a 79 y.o. female with a hx of chronic diastolic heart failure, hypertension, hyperlipidemia, hypothyroidism, GERD who is referred by Dr. Tiburcio Pea for an evaluation of heart failure.  She had a nuclear stress test in 04/2010 for atypical chest pain, which showed small anterior apical defect likely breast attenuation.  She had carotid Dopplers in 2012 that showed 60 to 79% left ICA stenosis.  TTE in 09/2015 showed LVEF 65 to 70%, grade 1 diastolic dysfunction PASP 45.    She reports that she has been in [place] for last several years.  Previously followed with Dr. Mayford Knife.  Patient was on Lasix but has been off it for a few years.  States that she has been having issues with hyponatremia.  She denies any chest pain or dyspnea.  Does report occasional lightheadedness.   Past Medical History:  Diagnosis Date  . CHF (congestive heart failure) (HCC)     Past Surgical History:  Procedure Laterality Date  . BACK SURGERY     3 back surgery, 2004, 2006  . INTRACAPSULAR CATARACT EXTRACTION      Current Medications: Current Meds  Medication Sig  . amLODipine (NORVASC) 5 MG tablet Take 1 tablet (5 mg total) by mouth daily.  Marland Kitchen aspirin EC 81 MG tablet Take 81 mg by mouth daily.  Marland Kitchen CALCIUM PO Take 500 mg by mouth 3 (three) times daily.  . ergocalciferol (VITAMIN D2) 50000 units capsule Take 50,000 Units by mouth 2 (two) times a week.  . gabapentin (NEURONTIN) 300 MG capsule Take 300 mg by mouth 4 (four) times daily.  Marland Kitchen ibandronate (BONIVA) 150 MG tablet Take 150 mg by mouth every 30 (thirty) days. Take in the morning with a full glass of water, on an empty stomach, and do not  take anything else by mouth or lie down for the next 30 min.  Marland Kitchen leflunomide (ARAVA) 10 MG tablet Take 10 mg by mouth daily.  Marland Kitchen levothyroxine (SYNTHROID, LEVOTHROID) 25 MCG tablet Take 25 mcg by mouth daily before breakfast.  . losartan (COZAAR) 100 MG tablet Take 100 mg by mouth daily.  . meloxicam (MOBIC) 7.5 MG tablet Take 7.5 mg by mouth daily.  Marland Kitchen METHOTREXATE SODIUM IJ Inject 25 mg into the vein once a week.  . MORPHINE SULFATE PO Take 15 mg by mouth every 12 (twelve) hours as needed for severe pain (for pain).  . Multiple Vitamin (MULTI-VITAMIN PO) Take 1 tablet by mouth daily.  . pantoprazole (PROTONIX) 40 MG tablet Take 40 mg by mouth daily.  . Polyethylene Glycol 3350 (MIRALAX PO) Take 1 Container by mouth as needed (for constipation).  . predniSONE (DELTASONE) 5 MG tablet Take 0.5 tablet by mouth every other day  . rosuvastatin (CRESTOR) 20 MG tablet Take 20 mg by mouth daily.  Marland Kitchen sulfaSALAzine (AZULFIDINE) 500 MG tablet Take 500 mg by mouth every 6 (six) hours.  . traMADol (ULTRAM) 50 MG tablet Take 50 mg by mouth every 6 (six) hours as needed (for pain).  . triamcinolone cream (KENALOG) 0.1 % Apply 1 application topically 2 (two) times daily. Take as needed at the sign of breakout  . [  DISCONTINUED] amLODipine (NORVASC) 2.5 MG tablet Take 2.5 mg by mouth daily.     Allergies:   Contrast media [iodinated diagnostic agents], Cephalexin, Metoprolol tartrate, Neoporacin [bacitracin-neomycin-polymyxin], Omnicef [cefdinir], and Penicillins   Social History   Socioeconomic History  . Marital status: Widowed    Spouse name: Not on file  . Number of children: Not on file  . Years of education: Not on file  . Highest education level: Not on file  Occupational History  . Not on file  Tobacco Use  . Smoking status: Former Smoker    Quit date: 08/29/1958    Years since quitting: 60.7  . Smokeless tobacco: Former Network engineer and Sexual Activity  . Alcohol use: No  . Drug use: No    . Sexual activity: Not on file  Other Topics Concern  . Not on file  Social History Narrative  . Not on file   Social Determinants of Health   Financial Resource Strain:   . Difficulty of Paying Living Expenses:   Food Insecurity:   . Worried About Charity fundraiser in the Last Year:   . Arboriculturist in the Last Year:   Transportation Needs:   . Film/video editor (Medical):   Marland Kitchen Lack of Transportation (Non-Medical):   Physical Activity:   . Days of Exercise per Week:   . Minutes of Exercise per Session:   Stress:   . Feeling of Stress :   Social Connections:   . Frequency of Communication with Friends and Family:   . Frequency of Social Gatherings with Friends and Family:   . Attends Religious Services:   . Active Member of Clubs or Organizations:   . Attends Archivist Meetings:   Marland Kitchen Marital Status:      Family History: The patient's family history includes Heart attack in her father and mother; Heart disease in her sister and sister; Stroke in her sister.  ROS:   Please see the history of present illness.    All other systems reviewed and are negative.  EKGs/Labs/Other Studies Reviewed:    The following studies were reviewed today:   EKG:  EKG is ordered today.  The ekg ordered today demonstrates sinus rhythm, Q waves in V1/2, rate 72  Recent Labs: No results found for requested labs within last 8760 hours.  Recent Lipid Panel No results found for: CHOL, TRIG, HDL, CHOLHDL, VLDL, LDLCALC, LDLDIRECT  Physical Exam:    VS:  BP (!) 180/60   Pulse 72   Ht 5\' 2"  (1.575 m)   Wt 113 lb 9.6 oz (51.5 kg)   BMI 20.78 kg/m     Wt Readings from Last 3 Encounters:  05/28/19 113 lb 9.6 oz (51.5 kg)  11/01/15 139 lb 6.4 oz (63.2 kg)  10/11/15 135 lb (61.2 kg)     GEN:  in no acute distress HEENT: Normal NECK: No JVD; left carotid bruit CARDIAC:RRR, no murmurs, rubs, gallops RESPIRATORY:  Clear to auscultation without rales, wheezing or  rhonchi  ABDOMEN: Soft, non-tender, non-distended MUSCULOSKELETAL:  No edema SKIN: Warm and dry NEUROLOGIC:  Alert and oriented x 3 PSYCHIATRIC:  Normal affect   ASSESSMENT:    1. Lightheadedness   2. Stenosis of left carotid artery   3. Hypertension, unspecified type   4. Chronic diastolic heart failure (HCC)    PLAN:    Carotid stenosis: 60 to 79% left ICA stenosis on last ultrasound in 2012.  Reports intermittent lightheadedness.  Will  check carotid duplex.  Lightheadedness: Check carotid duplex as above.  Will check TTE to evaluate for structural heart disease.  Chronic diastolic heart failure: Has been off Lasix.  Appears euvolemic on exam.  Will check TTE as above  Hypertension: On losartan 100 mg daily and amlodipine 2.5 mg daily.  Will increase amlodipine to 5 mg daily.  Asked patient to check BP daily for next 2 weeks and call with results  RTC in 1 month   Medication Adjustments/Labs and Tests Ordered: Current medicines are reviewed at length with the patient today.  Concerns regarding medicines are outlined above.  Orders Placed This Encounter  Procedures  . ECHOCARDIOGRAM COMPLETE  . VAS US CAROTID   Meds ordered this encounter  Medications  . amLODipine (NORVASC) 5 MG tablet    Sig: Take 1 tablet (5 mg total) by mouth daily.    Dispense:  30 tablet    Refill:  3    Patient Instructions  Medication Instructions:  INCREASE amlodipine to 5 mg daily  *If you need a refill on your cardiac medications before your next appointment, please call your pharmacy*  Testing/Procedures: Your physician has requested that you have an echocardiogram. Echocardiography is a painless test that uses sound waves to create images of your heart. It provides your doctor with information about the size and shape of your heart and how well your heart's chambers and valves are working. This procedure takes approximately one hour. There are no restrictions for this procedure. This  will be done at our [facility] location:  [address]  Your physician has requested that you have a carotid duplex. This test is an ultrasound of the carotid arteries in your neck. It looks at blood flow through these arteries that supply the brain with blood. Allow one hour for this exam. There are no restrictions or special instructions.   Follow-Up: At [facility], you and your health needs are our priority.  As part of our continuing mission to provide you with exceptional heart care, we have created designated Provider Care Teams.  These Care Teams include your primary Cardiologist (physician) and Advanced Practice Providers (APPs -  Physician Assistants and Nurse Practitioners) who all work together to provide you with the care you need, when you need it.  We recommend signing up for the patient portal called "MyChart".  Sign up information is provided on this After Visit Summary.  MyChart is used to connect with patients for Virtual Visits (Telemedicine).  Patients are able to view lab/test results, encounter notes, upcoming appointments, etc.  Non-urgent messages can be sent to your provider as well.   To learn more about what you can do with MyChart, go to [email or web address].    Your next appointment:   1 month(s)  The format for your next appointment:   In Person  Provider:   Epifanio Lesches, MD         Signed, Little Ishikawa, MD  05/29/2019 12:52 AM    [facility]

## 2019-05-28 NOTE — Patient Instructions (Signed)
Medication Instructions:  INCREASE amlodipine to 5 mg daily  *If you need a refill on your cardiac medications before your next appointment, please call your pharmacy*  Testing/Procedures: Your physician has requested that you have an echocardiogram. Echocardiography is a painless test that uses sound waves to create images of your heart. It provides your doctor with information about the size and shape of your heart and how well your heart's chambers and valves are working. This procedure takes approximately one hour. There are no restrictions for this procedure. This will be done at our [facility] location:  [address]  Your physician has requested that you have a carotid duplex. This test is an ultrasound of the carotid arteries in your neck. It looks at blood flow through these arteries that supply the brain with blood. Allow one hour for this exam. There are no restrictions or special instructions.   Follow-Up: At [facility], you and your health needs are our priority.  As part of our continuing mission to provide you with exceptional heart care, we have created designated Provider Care Teams.  These Care Teams include your primary Cardiologist (physician) and Advanced Practice Providers (APPs -  Physician Assistants and Nurse Practitioners) who all work together to provide you with the care you need, when you need it.  We recommend signing up for the patient portal called "MyChart".  Sign up information is provided on this After Visit Summary.  MyChart is used to connect with patients for Virtual Visits (Telemedicine).  Patients are able to view lab/test results, encounter notes, upcoming appointments, etc.  Non-urgent messages can be sent to your provider as well.   To learn more about what you can do with MyChart, go to [email or web address].    Your next appointment:   1 month(s)  The format for your next appointment:   In Person  Provider:   Epifanio Lesches, MD

## 2019-06-04 ENCOUNTER — Telehealth: Payer: Self-pay | Admitting: Internal Medicine

## 2019-06-04 ENCOUNTER — Telehealth: Payer: Self-pay | Admitting: Cardiology

## 2019-06-04 MED ORDER — VALSARTAN 160 MG PO TABS: 160.0000 mg | ORAL_TABLET | Freq: Every day | ORAL | 3 refills | Status: DC

## 2019-06-04 NOTE — Telephone Encounter (Signed)
Returned call to patient regarding her BP.  She has only been on the amlodipine for a week, but has not seen any improvement in home blood pressure readings.  Still 150-190's.  Home cuff has not been validated in the office.  Will have patient switch losartan to valsartan 160 mg once daily.  Scheduled her for CVRR BP visit in 3 weeks.  Explained need to monitor BP daily and bring her list of readings as well as home meter to this appointment.

## 2019-06-04 NOTE — Telephone Encounter (Signed)
Returned call to patient regarding her BP. Reviewed call from earlier today and it appears that she continues to experience increased blood pressures with a systolic range from 150-190's. No overt signs or symptoms related to her hypertension. Reassured patient and gave anticipatory guidance to her and her daughter regarding when to seek a higher level of care. All questions were answered.

## 2019-06-04 NOTE — Telephone Encounter (Signed)
Pt c/o BP issue: STAT if pt c/o blurred vision, one-sided weakness or slurred speech  1. What are your last 5 BP readings? 158/59  2. Are you having any other symptoms (ex. Dizziness, headache, blurred vision, passed out)? Headaches and fatigue  3. What is your BP issue? Patient states BP has been elevated.

## 2019-06-04 NOTE — Telephone Encounter (Signed)
Returned call to patient of Dr. Bjorn Pippin. She was seen 5/27 and amlodipine dose was increased. She reports her BP is "going haywire".   BP was 158/59 this morning before medications. Recheck about 45 mins after meds while on the phone: 197, 189. Not sure if BP cuff is working appropriately.  She reports her BP in afternoon/evening come down slightly, but not like they should per her report. SBP ranges 170-180s.  She gets a headache in the afternoon. She said when her sodium is low, she also gets a headache.   She takes losartan 100mg  QAM and amlodipine between 7-8pm  Advised will notify MD/PharmD staff for recommendations on BP

## 2019-06-05 MED ORDER — AMLODIPINE BESYLATE 5 MG PO TABS: 5.0000 mg | ORAL_TABLET | Freq: Every day | ORAL | 3 refills | Status: DC

## 2019-06-05 NOTE — Addendum Note (Signed)
Addended by: Rosalee Kaufman on: 06/05/2019 04:15 PM   Modules accepted: Orders

## 2019-06-05 NOTE — Telephone Encounter (Signed)
Spoke with patient.  She called the on call service last night because BP was still in the 190's.  She was advised to take an extra 2.5 mg of amlodipine.  Her BP today is better at 148/76.  Advised she go back to just 5 mg amlodipine each evening, but okay to take extra 2.5 mg for next few evenings should she be as high as 190's.  She should call back next week, should it continue to be elevated.  Patient voiced understanding.

## 2019-06-05 NOTE — Telephone Encounter (Signed)
Will route to PharmD Belenda Cruise) as she was working with this patient on her medications.

## 2019-06-05 NOTE — Telephone Encounter (Signed)
Patient is calling to follow up in regards to whether or not she needs to permanently increase amLODipine (NORVASC) 5 MG tablet medication to assist with high BP. She states this morning her BP was 148/76.

## 2019-06-08 NOTE — Addendum Note (Signed)
Addended by: Orlene Och on: 06/08/2019 07:56 AM   Modules accepted: Orders

## 2019-06-15 ENCOUNTER — Ambulatory Visit (HOSPITAL_COMMUNITY)
Admission: RE | Admit: 2019-06-15 | Discharge: 2019-06-15 | Disposition: A | Discharge status: Home / Self Care | Payer: Medicare Other | Source: Ambulatory Visit | Attending: Cardiology | Admitting: Cardiology

## 2019-06-15 ENCOUNTER — Other Ambulatory Visit: Payer: Self-pay

## 2019-06-15 ENCOUNTER — Other Ambulatory Visit (HOSPITAL_COMMUNITY): Payer: Self-pay | Admitting: Cardiology

## 2019-06-15 DIAGNOSIS — I6523 Occlusion and stenosis of bilateral carotid arteries: Secondary | ICD-10-CM

## 2019-06-15 DIAGNOSIS — I6522 Occlusion and stenosis of left carotid artery: Secondary | ICD-10-CM

## 2019-06-15 DIAGNOSIS — R42 Dizziness and giddiness: Secondary | ICD-10-CM | POA: Diagnosis not present

## 2019-06-19 ENCOUNTER — Other Ambulatory Visit: Payer: Self-pay | Admitting: Cardiology

## 2019-06-23 ENCOUNTER — Ambulatory Visit: Payer: Medicare Other

## 2019-06-24 ENCOUNTER — Other Ambulatory Visit: Payer: Self-pay

## 2019-06-24 ENCOUNTER — Ambulatory Visit (HOSPITAL_COMMUNITY): Payer: Medicare Other | Attending: Internal Medicine

## 2019-06-24 DIAGNOSIS — I10 Essential (primary) hypertension: Secondary | ICD-10-CM | POA: Diagnosis not present

## 2019-06-24 DIAGNOSIS — R42 Dizziness and giddiness: Secondary | ICD-10-CM | POA: Insufficient documentation

## 2019-07-03 ENCOUNTER — Telehealth: Payer: Self-pay | Admitting: Cardiology

## 2019-07-03 DIAGNOSIS — I6522 Occlusion and stenosis of left carotid artery: Secondary | ICD-10-CM

## 2019-07-03 NOTE — Telephone Encounter (Signed)
Patient aware of echo and carotid results. Verbalized understanding.

## 2019-07-03 NOTE — Telephone Encounter (Signed)
Patient is returning Tabitha Wade call from yesterday in regards to echo results.

## 2019-07-10 ENCOUNTER — Other Ambulatory Visit: Payer: Self-pay | Admitting: Family Medicine

## 2019-07-10 DIAGNOSIS — R1032 Left lower quadrant pain: Secondary | ICD-10-CM

## 2019-07-22 ENCOUNTER — Telehealth: Payer: Self-pay

## 2019-07-22 ENCOUNTER — Encounter: Payer: Self-pay | Admitting: Cardiology

## 2019-07-22 ENCOUNTER — Ambulatory Visit (INDEPENDENT_AMBULATORY_CARE_PROVIDER_SITE_OTHER): Payer: Medicare Other | Admitting: Cardiology

## 2019-07-22 ENCOUNTER — Other Ambulatory Visit: Payer: Self-pay

## 2019-07-22 VITALS — BP 134/60 | HR 68 | Ht 62.0 in | Wt 117.2 lb

## 2019-07-22 DIAGNOSIS — R42 Dizziness and giddiness: Secondary | ICD-10-CM | POA: Diagnosis not present

## 2019-07-22 DIAGNOSIS — I5032 Chronic diastolic (congestive) heart failure: Secondary | ICD-10-CM | POA: Diagnosis not present

## 2019-07-22 DIAGNOSIS — I6529 Occlusion and stenosis of unspecified carotid artery: Secondary | ICD-10-CM

## 2019-07-22 DIAGNOSIS — I10 Essential (primary) hypertension: Secondary | ICD-10-CM | POA: Diagnosis not present

## 2019-07-22 NOTE — Telephone Encounter (Signed)
Spoke with patient's daughter to inform them the 13-hour prep for patient's CT scan with [place] this Friday has been called in to patient's [facility] in [place].  Prednisone 50mg  PO 07/23/19 @ 2300, 07/24/19 @ 5000 and 1100; Benadryl 50mg  PO 07/24/19 @ 1100.

## 2019-07-22 NOTE — Patient Instructions (Signed)

## 2019-07-22 NOTE — Progress Notes (Signed)
Cardiology Office Note:    Date:  07/25/2019   ID:  Tabitha Wade, Tabitha Wade [birth year], MRN [id number]  PCP:  Johny Blamer, MD  Cardiologist:  No primary care provider on file.  Electrophysiologist:  None   Referring MD: Johny Blamer, MD   Chief Complaint  Patient presents with  . Congestive Heart Failure    History of Present Illness:    Tabitha Wade is a 79 y.o. female with a hx of chronic diastolic heart failure, hypertension, hyperlipidemia, hypothyroidism, GERD who presents for follow-up.  She was referred by Dr. Tiburcio Pea for an evaluation of heart failure, initially seen on 05/28/2018.  She had a nuclear stress test in 04/2010 for atypical chest pain, which showed small anterior apical defect likely breast attenuation.  She had carotid Dopplers in 2012 that showed 60 to 79% left ICA stenosis.  TTE in 09/2015 showed LVEF 65 to 70%, grade 1 diastolic dysfunction PASP 45.    Echocardiogram on 06/24/2019 showed normal biventricular function, no significant valvular disease.  Right carotid 1 to 39% stenosis, left carotid 40 to 59% stenosis on duplex on 06/15/2019.  Since last clinic visit, she reports that she has been doing well.  She denies any chest pain, dyspnea, or syncope.  Does report occasional lightheadedness when standing.  She denies any palpitations.  States that most exertion she does is yard work.  Has been checking BP at home, has been 120-130s/50-60s.    Past Medical History:  Diagnosis Date  . CHF (congestive heart failure) (HCC)     Past Surgical History:  Procedure Laterality Date  . BACK SURGERY     3 back surgery, 2004, 2006  . INTRACAPSULAR CATARACT EXTRACTION      Current Medications: Current Meds  Medication Sig  . amLODipine (NORVASC) 5 MG tablet Take 1 tablet (5 mg total) by mouth daily.  Marland Kitchen CALCIUM PO Take 500 mg by mouth 3 (three) times daily.  . ergocalciferol (VITAMIN D2) 50000 units capsule Take 50,000 Units by mouth 2 (two) times a week.    . famotidine (PEPCID) 20 MG tablet Take 20 mg by mouth 2 (two) times daily.  . folic acid (FOLVITE) 1 MG tablet Take 1 mg by mouth daily.  Marland Kitchen gabapentin (NEURONTIN) 300 MG capsule Take 300 mg by mouth 4 (four) times daily.  Marland Kitchen leflunomide (ARAVA) 10 MG tablet Take 10 mg by mouth daily.  Marland Kitchen levothyroxine (SYNTHROID, LEVOTHROID) 25 MCG tablet Take 25 mcg by mouth daily before breakfast.  . meloxicam (MOBIC) 7.5 MG tablet Take 7.5 mg by mouth daily.  . MORPHINE SULFATE PO Take 15 mg by mouth every 12 (twelve) hours as needed for severe pain (for pain).  . Multiple Vitamin (MULTI-VITAMIN PO) Take 1 tablet by mouth daily.  Marland Kitchen omeprazole (PRILOSEC) 40 MG capsule Take 40 mg by mouth daily.  . predniSONE (DELTASONE) 5 MG tablet Take 0.5 tablet by mouth every other day  . rosuvastatin (CRESTOR) 20 MG tablet Take 20 mg by mouth daily.  Marland Kitchen sulfaSALAzine (AZULFIDINE) 500 MG tablet Take 500 mg by mouth every 6 (six) hours.  . traMADol (ULTRAM) 50 MG tablet Take 50 mg by mouth every 6 (six) hours as needed (for pain).  . valsartan (DIOVAN) 160 MG tablet TAKE 1 TABLET BY MOUTH EVERY DAY     Allergies:   Contrast media [iodinated diagnostic agents], Cephalexin, Metoprolol tartrate, Neoporacin [bacitracin-neomycin-polymyxin], Omnicef [cefdinir], and Penicillins   Social History   Socioeconomic History  . Marital status: Widowed  Spouse name: Not on file  . Number of children: Not on file  . Years of education: Not on file  . Highest education level: Not on file  Occupational History  . Not on file  Tobacco Use  . Smoking status: Former Smoker    Quit date: 08/29/1958    Years since quitting: 60.9  . Smokeless tobacco: Former Engineer, water and Sexual Activity  . Alcohol use: No  . Drug use: No  . Sexual activity: Not on file  Other Topics Concern  . Not on file  Social History Narrative  . Not on file   Social Determinants of Health   Financial Resource Strain:   . Difficulty of Paying  Living Expenses:   Food Insecurity:   . Worried About Programme researcher, broadcasting/film/video in the Last Year:   . Barista in the Last Year:   Transportation Needs:   . Freight forwarder (Medical):   Marland Kitchen Lack of Transportation (Non-Medical):   Physical Activity:   . Days of Exercise per Week:   . Minutes of Exercise per Session:   Stress:   . Feeling of Stress :   Social Connections:   . Frequency of Communication with Friends and Family:   . Frequency of Social Gatherings with Friends and Family:   . Attends Religious Services:   . Active Member of Clubs or Organizations:   . Attends Banker Meetings:   Marland Kitchen Marital Status:      Family History: The patient's family history includes Heart attack in her father and mother; Heart disease in her sister and sister; Stroke in her sister.  ROS:   Please see the history of present illness.    All other systems reviewed and are negative.  EKGs/Labs/Other Studies Reviewed:    The following studies were reviewed today:   EKG:  EKG is ordered today.  The ekg ordered today demonstrates sinus rhythm, Q waves in V1/2, rate 72  Recent Labs: No results found for requested labs within last 8760 hours.  Recent Lipid Panel No results found for: CHOL, TRIG, HDL, CHOLHDL, VLDL, LDLCALC, LDLDIRECT  Physical Exam:    VS:  BP 134/60   Pulse 68   Ht 5\' 2"  (1.575 m)   Wt 117 lb 3.2 oz (53.2 kg)   SpO2 100%   BMI 21.44 kg/m     Wt Readings from Last 3 Encounters:  07/22/19 117 lb 3.2 oz (53.2 kg)  05/28/19 113 lb 9.6 oz (51.5 kg)  11/01/15 139 lb 6.4 oz (63.2 kg)     GEN:  in no acute distress HEENT: Normal NECK: No JVD; left carotid bruit CARDIAC:RRR, no murmurs, rubs, gallops RESPIRATORY:  Clear to auscultation without rales, wheezing or rhonchi  ABDOMEN: Soft, non-tender, non-distended MUSCULOSKELETAL:  No edema SKIN: Warm and dry NEUROLOGIC:  Alert and oriented x 3 PSYCHIATRIC:  Normal affect   ASSESSMENT:    1.  Lightheadedness   2. Chronic diastolic heart failure (HCC)   3. Hypertension, unspecified type   4. Stenosis of carotid artery, unspecified laterality    PLAN:    Carotid stenosis: Right carotid 1 to 39% stenosis, left carotid 40 to 59% stenosis on carotid duplex on 06/15/2019.  Will follow with repeat duplex in 1 year.  Continue ASA and statin.    Lightheadedness: No significant stenosis on carotid duplex as above.  Echocardiogram on 06/24/2019 showed normal biventricular function, no significant valvular disease.  Chronic diastolic heart failure: Has  been off Lasix.  Appears euvolemic on exam.    Hypertension: On losartan 100 mg daily and amlodipine 5 mg daily.    RTC in 1 year   Medication Adjustments/Labs and Tests Ordered: Current medicines are reviewed at length with the patient today.  Concerns regarding medicines are outlined above.  No orders of the defined types were placed in this encounter.  No orders of the defined types were placed in this encounter.   Patient Instructions  Medication Instructions:  Your physician recommends that you continue on your current medications as directed. Please refer to the Current Medication list given to you today.   Follow-Up: At [facility], you and your health needs are our priority.  As part of our continuing mission to provide you with exceptional heart care, we have created designated Provider Care Teams.  These Care Teams include your primary Cardiologist (physician) and Advanced Practice Providers (APPs -  Physician Assistants and Nurse Practitioners) who all work together to provide you with the care you need, when you need it.  We recommend signing up for the patient portal called "MyChart".  Sign up information is provided on this After Visit Summary.  MyChart is used to connect with patients for Virtual Visits (Telemedicine).  Patients are able to view lab/test results, encounter notes, upcoming appointments, etc.  Non-urgent  messages can be sent to your provider as well.   To learn more about what you can do with MyChart, go to [email or web address].    Your next appointment:   12 month(s)  The format for your next appointment:   In Person  Provider:   Epifanio Lesches, MD        Signed, Little Ishikawa, MD  07/25/2019 11:48 AM    [facility]

## 2019-07-24 ENCOUNTER — Ambulatory Visit
Admission: RE | Admit: 2019-07-24 | Discharge: 2019-07-24 | Disposition: A | Discharge status: Home / Self Care | Payer: Medicare Other | Source: Ambulatory Visit | Attending: Family Medicine | Admitting: Family Medicine

## 2019-07-24 DIAGNOSIS — R1032 Left lower quadrant pain: Secondary | ICD-10-CM

## 2019-07-24 MED ORDER — DIPHENHYDRAMINE HCL 50 MG/ML IJ SOLN: 50.0000 mg | Freq: Once | INTRAMUSCULAR | Status: DC

## 2019-07-24 MED ORDER — DIPHENHYDRAMINE HCL 50 MG PO CAPS: 50.0000 mg | ORAL_CAPSULE | Freq: Once | ORAL | Status: DC

## 2019-07-24 MED ORDER — PREDNISONE 50 MG PO TABS: 50.0000 mg | ORAL_TABLET | Freq: Four times a day (QID) | ORAL | Status: DC

## 2019-07-24 MED ORDER — IOPAMIDOL (ISOVUE-300) INJECTION 61%
100.0000 mL | Freq: Once | INTRAVENOUS | Status: AC | PRN
  Administered 2019-07-24: 100 mL via INTRAVENOUS

## 2019-07-28 ENCOUNTER — Ambulatory Visit (INDEPENDENT_AMBULATORY_CARE_PROVIDER_SITE_OTHER): Payer: Medicare Other | Admitting: Pharmacist

## 2019-07-28 ENCOUNTER — Other Ambulatory Visit: Payer: Self-pay

## 2019-07-28 DIAGNOSIS — I10 Essential (primary) hypertension: Secondary | ICD-10-CM

## 2019-07-28 DIAGNOSIS — I6523 Occlusion and stenosis of bilateral carotid arteries: Secondary | ICD-10-CM

## 2019-07-28 NOTE — Patient Instructions (Addendum)
Return for a a follow up appointment in AS NEEDED (one year with Dr Bjorn Pippin)  Check your blood pressure at home daily (if able) and keep record of the readings.  Take your BP meds as follows: *NO CHANGE*  Bring all of your meds, your BP cuff and your record of home blood pressures to your next appointment.  Exercise as you're able, try to walk approximately 30 minutes per day.  Keep salt intake to a minimum, especially watch canned and prepared boxed foods.  Eat more fresh fruits and vegetables and fewer canned items.  Avoid eating in fast food restaurants.    HOW TO TAKE YOUR BLOOD PRESSURE: . Rest 5 minutes before taking your blood pressure. .  Don't smoke or drink caffeinated beverages for at least 30 minutes before. . Take your blood pressure before (not after) you eat. . Sit comfortably with your back supported and both feet on the floor (don't cross your legs). . Elevate your arm to heart level on a table or a desk. . Use the proper sized cuff. It should fit smoothly and snugly around your bare upper arm. There should be enough room to slip a fingertip under the cuff. The bottom edge of the cuff should be 1 inch above the crease of the elbow. . Ideally, take 3 measurements at one sitting and record the average.

## 2019-07-28 NOTE — Progress Notes (Signed)
Patient ID: Tabitha Wade                 DOB: 08/19/1940                      MRN: [id number]     HPI: Tabitha Wade is a 79 y.o. female referred by Dr. Bjorn Pippin  to HTN clinic. PMH includes chronic diastolic heart failure, hypertension, hyperlipidemia, hypothyroidism, and chronic pain. Patient presents for HTN follow up. Denies dizziness, swelling, headaches, blurry vision, or chest pain. Reports pain 5 of 10 during appointment.   Current HTN meds:  valsartan 160mg  daily Amlodipine 5mg  daily  Previously tried:  Metoprolol - labored breathing and elevated BP  BP goal: 130/80  Family History: The patient's family history includes Heart attack in her father and mother; Heart disease in her sister and sister; Stroke in her sister.  Social History: former smoker, denies alcohol use  Diet: sip on gatoraid during the day, all home cooked meals  Exercise: activities of daily living, and exercise in pool  Home BP readings: no records provided Systolic 110s to per patient's history  Wt Readings from Last 3 Encounters:  07/28/19 113 lb 3.2 oz (51.3 kg)  07/22/19 117 lb 3.2 oz (53.2 kg)  05/28/19 113 lb 9.6 oz (51.5 kg)   BP Readings from Last 3 Encounters:  07/28/19 (!) 142/60  07/22/19 134/60  05/28/19 (!) 180/60   Pulse Readings from Last 3 Encounters:  07/28/19 76  07/22/19 68  05/28/19 72    Past Medical History:  Diagnosis Date  . CHF (congestive heart failure) (HCC)     Current Outpatient Medications on File Prior to Visit  Medication Sig Dispense Refill  . amLODipine (NORVASC) 5 MG tablet Take 1 tablet (5 mg total) by mouth daily. 90 tablet 3  . CALCIUM PO Take 500 mg by mouth 3 (three) times daily.    . ergocalciferol (VITAMIN D2) 50000 units capsule Take 50,000 Units by mouth 2 (two) times a week.    . famotidine (PEPCID) 20 MG tablet Take 20 mg by mouth 2 (two) times daily.    . folic acid (FOLVITE) 1 MG tablet Take 1 mg by mouth daily.    07/24/19 gabapentin  (NEURONTIN) 300 MG capsule Take 300 mg by mouth 4 (four) times daily.    05/30/19 leflunomide (ARAVA) 10 MG tablet Take 10 mg by mouth daily.    Marland Kitchen levothyroxine (SYNTHROID, LEVOTHROID) 25 MCG tablet Take 25 mcg by mouth daily before breakfast.    . meloxicam (MOBIC) 7.5 MG tablet Take 7.5 mg by mouth daily.    . MORPHINE SULFATE PO Take 15 mg by mouth every 12 (twelve) hours as needed for severe pain (for pain).    . Multiple Vitamin (MULTI-VITAMIN PO) Take 1 tablet by mouth daily.    Marland Kitchen omeprazole (PRILOSEC) 40 MG capsule Take 40 mg by mouth daily.    . predniSONE (DELTASONE) 5 MG tablet Take 0.5 tablet by mouth every other day    . rosuvastatin (CRESTOR) 20 MG tablet Take 20 mg by mouth daily.    Marland Kitchen sulfaSALAzine (AZULFIDINE) 500 MG tablet Take 500 mg by mouth every 6 (six) hours.    . traMADol (ULTRAM) 50 MG tablet Take 50 mg by mouth every 6 (six) hours as needed (for pain).    . valsartan (DIOVAN) 160 MG tablet TAKE 1 TABLET BY MOUTH EVERY DAY 90 tablet 3   No current facility-administered medications on file  prior to visit.    Allergies  Allergen Reactions  . Contrast Media [Iodinated Diagnostic Agents]     Pt received 13 hr prep , hx of prev  reaction , patient unsure  If intravenous vs topical- stated told that if allergic to topical could be allergic to iv  . Cephalexin   . Metoprolol Tartrate   . Neoporacin [Bacitracin-Neomycin-Polymyxin]   . Omnicef [Cefdinir]   . Penicillins     Blood pressure (!) 142/60, pulse 76, height 5\' 5"  (1.651 m), weight 113 lb 3.2 oz (51.3 kg).  Hypertension Blood pressure slightly above goal, but patient currently in pain. Her PCP also manage her BP and Dr follow up every 12 months. Per patient's history her BP at home remains between 110s to 130s systolic.   Will continue current medication without changes and follow up by phone in 4 weeks. Patient to follow up with PCP as previously scheduled and with HTN clinic as needed.   Hebert Dooling  Rodriguez-Guzman PharmD, BCPS, CPP [facility] [address] [place] 07/29/2019 1:52 PM

## 2019-07-29 ENCOUNTER — Encounter: Payer: Self-pay | Admitting: Pharmacist

## 2019-07-29 DIAGNOSIS — I10 Essential (primary) hypertension: Secondary | ICD-10-CM | POA: Insufficient documentation

## 2019-07-29 NOTE — Assessment & Plan Note (Signed)
Blood pressure slightly above goal, but patient currently in pain. Her PCP also manage her BP and Dr Bjorn Pippin follow up every 12 months. Per patient's history her BP at home remains between 110s to 130s systolic.   Will continue current medication without changes and follow up by phone in 4 weeks. Patient to follow up with PCP as previously scheduled and with HTN clinic as needed.

## 2019-08-26 ENCOUNTER — Telehealth: Payer: Self-pay | Admitting: Pharmacist

## 2019-08-26 NOTE — Telephone Encounter (Signed)
BP remains around 120-130. Patient remains asymptomatic and PCP to follow up as needed.   She was encouraged to contac our clinic with any questions.

## 2019-09-15 ENCOUNTER — Other Ambulatory Visit: Payer: Self-pay | Admitting: Cardiology

## 2019-10-18 ENCOUNTER — Other Ambulatory Visit: Payer: Self-pay | Admitting: Cardiology

## 2019-10-19 ENCOUNTER — Telehealth: Payer: Self-pay | Admitting: Cardiology

## 2019-10-19 NOTE — Telephone Encounter (Signed)
*  STAT* If patient is at the pharmacy, call can be transferred to refill team.   1. Which medications need to be refilled? (please list name of each medication and dose if known) amlodipine 5 mg  2. Which pharmacy/location (including street and city if local pharmacy) is medication to be sent to? [facility]  3. Do they need a 30 day or 90 day supply? 90 would be better

## 2020-04-05 ENCOUNTER — Other Ambulatory Visit: Payer: Self-pay | Admitting: Family Medicine

## 2020-04-05 DIAGNOSIS — R202 Paresthesia of skin: Secondary | ICD-10-CM

## 2020-04-08 ENCOUNTER — Ambulatory Visit
Admission: RE | Admit: 2020-04-08 | Discharge: 2020-04-08 | Disposition: A | Discharge status: Home / Self Care | Payer: Medicare Other | Source: Ambulatory Visit | Attending: Family Medicine | Admitting: Family Medicine

## 2020-04-08 ENCOUNTER — Other Ambulatory Visit: Payer: Self-pay | Admitting: Family Medicine

## 2020-04-08 DIAGNOSIS — R5081 Fever presenting with conditions classified elsewhere: Secondary | ICD-10-CM

## 2020-04-13 ENCOUNTER — Other Ambulatory Visit: Payer: Medicare Other

## 2020-04-24 ENCOUNTER — Ambulatory Visit
Admission: RE | Admit: 2020-04-24 | Discharge: 2020-04-24 | Disposition: A | Discharge status: Home / Self Care | Payer: Medicare Other | Source: Ambulatory Visit | Attending: Family Medicine | Admitting: Family Medicine

## 2020-04-24 ENCOUNTER — Other Ambulatory Visit: Payer: Self-pay

## 2020-04-24 DIAGNOSIS — R202 Paresthesia of skin: Secondary | ICD-10-CM

## 2020-06-13 ENCOUNTER — Ambulatory Visit: Payer: Medicare Other | Admitting: Diagnostic Neuroimaging

## 2020-06-14 ENCOUNTER — Telehealth: Payer: Self-pay | Admitting: Cardiology

## 2020-06-14 ENCOUNTER — Encounter (HOSPITAL_COMMUNITY): Payer: Medicare Other

## 2020-06-14 NOTE — Telephone Encounter (Signed)
   [facility] Pre-operative Risk Assessment    Patient Name: Tabitha Wade  DOB: 1940-03-18  MRN: [id number]    Request for surgical clearance:  What type of surgery is being performed? Cervical fusion   When is this surgery scheduled? TBD   What type of clearance is required (medical clearance vs. Pharmacy clearance to hold med vs. Both)? Medical  Are there any medications that need to be held prior to surgery and how long? None specified    Practice name and name of physician performing surgery? Dr. Newman Pies with [facility]   What is the office phone number? [phone] (ext: [phone])   7.   What is the office fax number? [phone] (attn: Nikki)  8.   Anesthesia type (None, local, MAC, general) ? General    Sheral Apley M 06/14/2020, 3:24 PM  _________________________________________________________________   (provider comments below)

## 2020-06-14 NOTE — Telephone Encounter (Signed)
   Name: Tabitha Wade  DOB: 1940-06-03  MRN: [id number]  Primary Cardiologist: Dr. Bjorn Pippin  Chart reviewed as part of pre-operative protocol coverage. Because of Mariane Keefe's past medical history and time since last visit, she will require a follow-up visit in order to better assess preoperative cardiovascular risk.  Pre-op covering staff: - Please schedule appointment and call patient to inform them. If patient already had an upcoming appointment within acceptable timeframe, please add "pre-op clearance" to the appointment notes so provider is aware. - Please contact requesting surgeon's office via preferred method (i.e, phone, fax) to inform them of need for appointment prior to surgery.  If applicable, this message will also be routed to pharmacy pool and/or primary cardiologist for input on holding anticoagulant/antiplatelet agent as requested below so that this information is available to the clearing provider at time of patient's appointment.   Georgie Chard, NP  06/14/2020, 3:45 PM

## 2020-06-14 NOTE — Telephone Encounter (Signed)
I could not find any available appts at the [facility] office for Dr. Jerene Pitch or APP. I checked the [facility] office and called the pt and s/w her and her daughter. I explained the situation about how full the schedules are though I could offer appt at [facility]. Pt is agreeable to plan of care. I have scheduled the pt to see Nada Boozer, NP 07/13/20 @ 2:15 at the [facility] location. Both the pt and her daughter read back date, time, location and provider they will be seeing. I assured the pt that 2 things, I will send a message to Dr. Ellie Lunch nurse to see if she can help with getting an appt sooner for the pt.   While I was speaking with the pt and her daughter Dr. Ellie Lunch nurse sent me a message we could squeeze the pt in 06/24/20 @ 8:20 with Dr. Jerene Pitch. The pt and her daughter are so very grateful for all of our help and efforts in caring for the pt. I will forward clearance notes to MD for appt. Will send FYI to surgeon's office pt has appt 06/24/20.

## 2020-06-15 ENCOUNTER — Other Ambulatory Visit: Payer: Self-pay | Admitting: Neurosurgery

## 2020-06-16 ENCOUNTER — Other Ambulatory Visit (HOSPITAL_COMMUNITY): Payer: Self-pay | Admitting: Cardiology

## 2020-06-16 ENCOUNTER — Other Ambulatory Visit: Payer: Self-pay

## 2020-06-16 ENCOUNTER — Ambulatory Visit (HOSPITAL_COMMUNITY)
Admission: RE | Admit: 2020-06-16 | Discharge: 2020-06-16 | Disposition: A | Discharge status: Home / Self Care | Payer: Medicare Other | Source: Ambulatory Visit | Attending: Internal Medicine | Admitting: Internal Medicine

## 2020-06-16 DIAGNOSIS — I6523 Occlusion and stenosis of bilateral carotid arteries: Secondary | ICD-10-CM | POA: Diagnosis present

## 2020-06-19 NOTE — Progress Notes (Deleted)
Cardiology Office Note:    Date:  06/19/2020   ID:  Tabitha Wade, Tabitha Wade Mar 08, 1940, MRN [id number]  PCP:  Johny Blamer, MD  Cardiologist:  None  Electrophysiologist:  None   Referring MD: Johny Blamer, MD   No chief complaint on file.   History of Present Illness:    Tabitha Wade is a 80 y.o. female with a hx of chronic diastolic heart failure, hypertension, hyperlipidemia, hypothyroidism, GERD who presents for follow-up.  She was referred by Dr. Tiburcio Pea for an evaluation of heart failure, initially seen on 05/28/2018.  She had a nuclear stress test in 04/2010 for atypical chest pain, which showed small anterior apical defect likely breast attenuation.  She had carotid Dopplers in 2012 that showed 60 to 79% left ICA stenosis.  TTE in 09/2015 showed LVEF 65 to 70%, grade 1 diastolic dysfunction PASP 45.    Echocardiogram on 06/24/2019 showed normal biventricular function, no significant valvular disease.  Right carotid 1 to 39% stenosis, left carotid 40 to 59% stenosis on duplex on 06/15/2019.  Carotid duplex on 06/16/2020 showed 1 to 39% right carotid stenosis, 40 to 59% left carotid stenosis.  Since last clinic visit,  Preop:  she reports that she has been doing well.  She denies any chest pain, dyspnea, or syncope.  Does report occasional lightheadedness when standing.  She denies any palpitations.  States that most exertion she does is yard work.  Has been checking BP at home, has been 120-130s/50-60s.    Past Medical History:  Diagnosis Date   CHF (congestive heart failure) (HCC)     Past Surgical History:  Procedure Laterality Date   BACK SURGERY     3 back surgery, 2004, 2006   INTRACAPSULAR CATARACT EXTRACTION      Current Medications: No outpatient medications have been marked as taking for the 06/24/20 encounter (Appointment) with Little Ishikawa, MD.     Allergies:   Contrast media [iodinated diagnostic agents], Cephalexin, Metoprolol tartrate,  Neoporacin [bacitracin-neomycin-polymyxin], Omnicef [cefdinir], and Penicillins   Social History   Socioeconomic History   Marital status: Widowed    Spouse name: Not on file   Number of children: Not on file   Years of education: Not on file   Highest education level: Not on file  Occupational History   Not on file  Tobacco Use   Smoking status: Former    Pack years: 0.00    Types: Cigarettes    Quit date: 08/29/1958    Years since quitting: 61.8   Smokeless tobacco: Former  Substance and Sexual Activity   Alcohol use: No   Drug use: No   Sexual activity: Not on file  Other Topics Concern   Not on file  Social History Narrative   Not on file   Social Determinants of Health   Financial Resource Strain: Not on file  Food Insecurity: Not on file  Transportation Needs: Not on file  Physical Activity: Not on file  Stress: Not on file  Social Connections: Not on file     Family History: The patient's family history includes Heart attack in her father and mother; Heart disease in her sister and sister; Stroke in her sister.  ROS:   Please see the history of present illness.    All other systems reviewed and are negative.  EKGs/Labs/Other Studies Reviewed:    The following studies were reviewed today:   EKG:  EKG is ordered today.  The ekg ordered today demonstrates sinus rhythm, Q waves  in V1/2, rate 72  Recent Labs: No results found for requested labs within last 8760 hours.  Recent Lipid Panel No results found for: CHOL, TRIG, HDL, CHOLHDL, VLDL, LDLCALC, LDLDIRECT  Physical Exam:    VS:  There were no vitals taken for this visit.    Wt Readings from Last 3 Encounters:  07/28/19 113 lb 3.2 oz (51.3 kg)  07/22/19 117 lb 3.2 oz (53.2 kg)  05/28/19 113 lb 9.6 oz (51.5 kg)     GEN:  in no acute distress HEENT: Normal NECK: No JVD; left carotid bruit CARDIAC:RRR, no murmurs, rubs, gallops RESPIRATORY:  Clear to auscultation without rales, wheezing or  rhonchi  ABDOMEN: Soft, non-tender, non-distended MUSCULOSKELETAL:  No edema SKIN: Warm and dry NEUROLOGIC:  Alert and oriented x 3 PSYCHIATRIC:  Normal affect   ASSESSMENT:    No diagnosis found.  PLAN:    Preop evaluation:   Carotid stenosis: Right carotid 1 to 39% stenosis, left carotid 40 to 59% stenosis on carotid duplex on 06/15/2019.  Carotid duplex on 06/16/2020 showed 1 to 39% right carotid stenosis, 40 to 59% left carotid stenosis.  Will follow with repeat duplex in 1 year.  Continue ASA and statin.    Lightheadedness: No significant stenosis on carotid duplex as above.  Echocardiogram on 06/24/2019 showed normal biventricular function, no significant valvular disease.  Chronic diastolic heart failure: Has been off Lasix.  Appears euvolemic on exam.    Hypertension: On losartan 100 mg daily and amlodipine 5 mg daily.    RTC in ***   Medication Adjustments/Labs and Tests Ordered: Current medicines are reviewed at length with the patient today.  Concerns regarding medicines are outlined above.  No orders of the defined types were placed in this encounter.  No orders of the defined types were placed in this encounter.   There are no Patient Instructions on file for this visit.   Signed, Little Ishikawa, MD  06/19/2020 3:53 PM    [facility]

## 2020-06-20 NOTE — Progress Notes (Signed)
Cardiology Office Note:    Date:  06/24/2020   ID:  Tabitha, Wade 1940-03-09, MRN [id number]  PCP:  Johny Blamer, MD  Cardiologist:  None  Electrophysiologist:  None   Referring MD: Johny Blamer, MD   Chief Complaint  Patient presents with   Follow-up   Edema    Feet.      History of Present Illness:    Tabitha Wade is a 80 y.o. female with a hx of chronic diastolic heart failure, hypertension, hyperlipidemia, hypothyroidism, GERD who presents for follow-up.  She was referred by Dr. Tiburcio Pea for an evaluation of heart failure, initially seen on 05/28/2018.  She had a nuclear stress test in 04/2010 for atypical chest pain, which showed small anterior apical defect likely breast attenuation.  She had carotid Dopplers in 2012 that showed 60 to 79% left ICA stenosis.  TTE in 09/2015 showed LVEF 65 to 70%, grade 1 diastolic dysfunction PASP 45.    Echocardiogram on 06/24/2019 showed normal biventricular function, no significant valvular disease.  Right carotid 1 to 39% stenosis, left carotid 40 to 59% stenosis on duplex on 06/15/2019.  Carotid duplex on 06/16/2020 showed 1 to 39% right carotid stenosis, 40 to 59% left carotid stenosis.  Preop: She has a bone spur pushing on her spinal cord. She is scheduled for surgery 07/06/2020 with Dr. Lovell Sheehan.  Today she is accompanied by her daughter, who also provides some history. Since last clinic visit, Dr. Tiburcio Pea started a new medication due to some bladder incompetency. However, after 5 days she developed a side effect of LE edema and this was discontinued. Usually the edema will worsen by the end of the day. Lately she has noticed that she has lost some weight. Also, she has a burning pain in her chest that is constant, and worsens when she presses with her hand. 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 Occasionally she will have shortness of breath if she pushes herself too hard. For exercise, she would typically walk around  the yard. However, her daughter reports since 02/2020 her amount of physical activity has declined due to her symptoms. Recently, she tries to walk around the house for some activity. She endorses being able to climb a flight of stairs without issue, but they do not have many stairs in their home. Her activity is mainly limited by right knee pain that she will have another injection to help manage the pain. At home, her blood pressure is typically elevated, and they are surprised at her reading today. She remains compliant with valsartan and amlodipine for hypertension. Her regimen does not include aspirin currently. She denies any palpitations, or exertional symptoms. No headaches, lightheadedness, or syncope to report. Also has no orthopnea or PND.   Past Medical History:  Diagnosis Date   CHF (congestive heart failure) (HCC)     Past Surgical History:  Procedure Laterality Date   BACK SURGERY     3 back surgery, 2004, 2006   INTRACAPSULAR CATARACT EXTRACTION      Current Medications: Current Meds  Medication Sig   Ascorbic Acid (VITAMIN C) 1000 MG tablet Take 1,000 mg by mouth daily.   CALCIUM PO Take 500 mg by mouth 3 (three) times daily.   famotidine (PEPCID) 20 MG tablet Take 20 mg by mouth daily as needed for heartburn or indigestion.   gabapentin (NEURONTIN) 300 MG capsule Take 300 mg by mouth 3 (three) times daily.   glucosamine-chondroitin 500-400 MG tablet Take 1 tablet by mouth  daily.   levothyroxine (SYNTHROID) 75 MCG tablet Take 75 mcg by mouth daily before breakfast.   meloxicam (MOBIC) 7.5 MG tablet Take 7.5 mg by mouth in the morning.   morphine (MSIR) 15 MG tablet Take 15 mg by mouth every 12 (twelve) hours as needed for severe pain (for pain).   Multiple Vitamin (MULTI-VITAMIN PO) Take 1 tablet by mouth daily.   omeprazole (PRILOSEC) 40 MG capsule Take 40 mg by mouth daily before breakfast.   ondansetron (ZOFRAN) 4 MG tablet Take 4 mg by mouth every 8 (eight) hours as  needed for nausea or vomiting.   oxybutynin (DITROPAN-XL) 5 MG 24 hr tablet Take 5 mg by mouth at bedtime.   rosuvastatin (CRESTOR) 20 MG tablet Take 20 mg by mouth at bedtime.   sulfaSALAzine (AZULFIDINE) 500 MG tablet Take 500 mg by mouth at bedtime.   traMADol (ULTRAM) 50 MG tablet Take 50 mg by mouth every 6 (six) hours as needed (for pain).   Turmeric 500 MG CAPS Take 1,000 mg by mouth in the morning and at bedtime.   valsartan (DIOVAN) 160 MG tablet TAKE 1 TABLET BY MOUTH EVERY DAY (Patient taking differently: Take 160 mg by mouth in the morning.)   [DISCONTINUED] amLODipine (NORVASC) 5 MG tablet TAKE 1 TABLET EVERY DAY (Patient taking differently: Take 5 mg by mouth at bedtime.)     Allergies:   Contrast media [iodinated diagnostic agents], Metoprolol tartrate, Penicillins, Cephalexin, Neoporacin [bacitracin-neomycin-polymyxin], Omnicef [cefdinir], and Oxybutynin chloride   Social History   Socioeconomic History   Marital status: Widowed    Spouse name: Not on file   Number of children: Not on file   Years of education: Not on file   Highest education level: Not on file  Occupational History   Not on file  Tobacco Use   Smoking status: Former    Pack years: 0.00    Types: Cigarettes    Quit date: 08/29/1958    Years since quitting: 61.8   Smokeless tobacco: Former  Substance and Sexual Activity   Alcohol use: No   Drug use: No   Sexual activity: Not on file  Other Topics Concern   Not on file  Social History Narrative   Not on file   Social Determinants of Health   Financial Resource Strain: Not on file  Food Insecurity: Not on file  Transportation Needs: Not on file  Physical Activity: Not on file  Stress: Not on file  Social Connections: Not on file     Family History: The patient's family history includes Heart attack in her father and mother; Heart disease in her sister and sister; Stroke in her sister.  ROS:   Please see the history of present illness.     (+) Burning chest pain (+) Bone spur (+) Bladder incompetence (+) Shortness of breath (+) Right knee pain (+) Bilateral LE edema All other systems reviewed and are negative.  EKGs/Labs/Other Studies Reviewed:    The following studies were reviewed today:  US Carotid Duplex 06/16/2020: Right Carotid: Velocities in the right ICA are consistent with a 1-39%  stenosis. Non-hemodynamically significant plaque <50% noted in the  CCA.   Left Carotid: Velocities in the left ICA are consistent with a 40-59%  stenosis. Non-hemodynamically significant plaque <50% noted in the  CCA.   Vertebrals:  Bilateral vertebral arteries demonstrate antegrade flow.  Subclavians: Normal flow hemodynamics were seen in bilateral subclavian arteries.   Echo 06/24/2019: 1. Left ventricular ejection fraction, by estimation,  is 65%. The left  ventricle has normal function. The left ventricle has no regional wall  motion abnormalities. Left ventricular diastolic parameters were normal.   2. Right ventricular systolic function is normal. The right ventricular  size is mildly enlarged. There is normal pulmonary artery systolic  pressure. The estimated right ventricular systolic pressure is 31.9 mmHg.   3. The mitral valve is normal in structure. Trivial mitral valve  regurgitation. No evidence of mitral stenosis.   4. The aortic valve is tricuspid. Aortic valve regurgitation is not  visualized. No aortic stenosis is present.   5. The inferior vena cava is normal in size with greater than 50%  respiratory variability, suggesting right atrial pressure of 3 mmHg.   EKG:   06/24/2020: sinus rhythm, rate 83 bpm, Q waves in V1/2 05/28/2019: sinus rhythm, Q waves in V1/2, rate 72  Recent Labs: No results found for requested labs within last 8760 hours.  Recent Lipid Panel No results found for: CHOL, TRIG, HDL, CHOLHDL, VLDL, LDLCALC, LDLDIRECT  Physical Exam:    VS:  BP (!) 104/50 (BP Location: Left Arm,  Patient Position: Sitting, Cuff Size: Normal)   Pulse 83   Ht 5\' 5"  (1.651 m)   Wt 106 lb (48.1 kg)   BMI 17.64 kg/m     Wt Readings from Last 3 Encounters:  06/24/20 106 lb (48.1 kg)  07/28/19 113 lb 3.2 oz (51.3 kg)  07/22/19 117 lb 3.2 oz (53.2 kg)     GEN:  in no acute distress HEENT: Normal NECK: No JVD; +bilateral carotid bruits CARDIAC:RRR, no murmurs, rubs, gallops RESPIRATORY:  Clear to auscultation without rales, wheezing or rhonchi  ABDOMEN: Soft, non-tender, non-distended MUSCULOSKELETAL:  +bilateral LE trace edema SKIN: Warm and dry NEUROLOGIC:  Alert and oriented x 3 PSYCHIATRIC:  Normal affect   ASSESSMENT:    1. Pre-op evaluation   2. Chronic diastolic heart failure (HCC)   3. Atherosclerosis of both carotid arteries   4. Hypertension, unspecified type   5. Hyperlipidemia, unspecified hyperlipidemia type     PLAN:    Preop evaluation: Prior to cervical spinal surgery.  No symptoms to suggest active cardiac condition.  Does report chest pain that she describes as constant burning pain across her upper chest, unrelated to exertion but worsened with palpation, suspect neurologic in origin from her cervical spinal stenosis.  RCRI score 1.  Functional capacity greater than 4 METS, reports can walk up a flight of stairs without chest pain or dyspnea. -No further cardiac work-up recommended prior to surgery -She has stopped taking daily aspirin, which she was taking for carotid stenosis.  OK to continue to hold aspirin for surgery -Continue statin perioperatively  Carotid stenosis: Right carotid 1 to 39% stenosis, left carotid 40 to 59% stenosis on carotid duplex on 06/15/2019.  Carotid duplex on 06/16/2020 showed 1 to 39% right carotid stenosis, 40 to 59% left carotid stenosis.  Will follow with repeat duplex in 1 year.  Continue statin, will check a lipid panel.  Would recommend restarting aspirin 81 mg daily after her surgery as above.  Lightheadedness: No  significant stenosis on carotid duplex as above.  Echocardiogram on 06/24/2019 showed normal biventricular function, no significant valvular disease.  Reports symptoms have improved.  Chronic diastolic heart failure: Has been off Lasix.  She reports recent lower extremity edema.  Trace edema on exam, otherwise appears euvolemic, no JVD and lungs CTAB.  Will check CMP, BNP.  Suspect due to amlodipine use, will decrease  to 2.5 mg daily given soft BP.  Hypertension: On valsartan 160 mg daily and amlodipine 5 mg daily.  Soft BP in clinic today, will decrease amlodipine to 2.5 mg daily.  Asked to check BP twice daily for next week and call office with results.  RTC in 6 months   Medication Adjustments/Labs and Tests Ordered: Current medicines are reviewed at length with the patient today.  Concerns regarding medicines are outlined above.  Orders Placed This Encounter  Procedures   Comprehensive metabolic panel   CBC   Lipid panel   Brain natriuretic peptide   EKG 12-Lead    Meds ordered this encounter  Medications   amLODipine (NORVASC) 5 MG tablet    Sig: Take 0.5 tablets (2.5 mg total) by mouth daily.    Dispense:  90 tablet    Refill:  2     Patient Instructions  Medication Instructions:  DECREASE amlodipine to 2.5 mg once daily  Please check your blood pressure at home twice daily, write it down.  Call the office or send message via Mychart with the readings in next week for Dr. Bjorn Pippin to review.   *If you need a refill on your cardiac medications before your next appointment, please call your pharmacy*   Lab Work: CMET, CBC, Lipid, BNP today  If you have labs (blood work) drawn today and your tests are completely normal, you will receive your results only by: MyChart Message (if you have MyChart) OR A paper copy in the mail If you have any lab test that is abnormal or we need to change your treatment, we will call you to review the results.  Follow-Up: At [facility], you and your health needs are our priority.  As part of our continuing mission to provide you with exceptional heart care, we have created designated Provider Care Teams.  These Care Teams include your primary Cardiologist (physician) and Advanced Practice Providers (APPs -  Physician Assistants and Nurse Practitioners) who all work together to provide you with the care you need, when you need it.  We recommend signing up for the patient portal called "MyChart".  Sign up information is provided on this After Visit Summary.  MyChart is used to connect with patients for Virtual Visits (Telemedicine).  Patients are able to view lab/test results, encounter notes, upcoming appointments, etc.  Non-urgent messages can be sent to your provider as well.   To learn more about what you can do with MyChart, go to [email or web address].    Your next appointment:   6 month(s)  The format for your next appointment:   In Person  Provider:   Epifanio Lesches, MD     [facility],acting as a scribe for Little Ishikawa, MD.,have documented all relevant documentation on the behalf of Little Ishikawa, MD,as directed by  Little Ishikawa, MD while in the presence of Little Ishikawa, MD.  I, Little Ishikawa, MD, have reviewed all documentation for this visit. The documentation on 06/24/20 for the exam, diagnosis, procedures, and orders are all accurate and complete.   Signed, Little Ishikawa, MD  06/24/2020 9:07 AM    [facility]

## 2020-06-22 ENCOUNTER — Other Ambulatory Visit: Payer: Self-pay | Admitting: Neurosurgery

## 2020-06-24 ENCOUNTER — Other Ambulatory Visit: Payer: Self-pay

## 2020-06-24 ENCOUNTER — Encounter: Payer: Self-pay | Admitting: Cardiology

## 2020-06-24 ENCOUNTER — Ambulatory Visit (INDEPENDENT_AMBULATORY_CARE_PROVIDER_SITE_OTHER): Payer: Medicare Other | Admitting: Cardiology

## 2020-06-24 VITALS — BP 104/50 | HR 83 | Ht 65.0 in | Wt 106.0 lb

## 2020-06-24 DIAGNOSIS — I5032 Chronic diastolic (congestive) heart failure: Secondary | ICD-10-CM | POA: Diagnosis not present

## 2020-06-24 DIAGNOSIS — Z01818 Encounter for other preprocedural examination: Secondary | ICD-10-CM

## 2020-06-24 DIAGNOSIS — I10 Essential (primary) hypertension: Secondary | ICD-10-CM

## 2020-06-24 DIAGNOSIS — E785 Hyperlipidemia, unspecified: Secondary | ICD-10-CM

## 2020-06-24 DIAGNOSIS — I6523 Occlusion and stenosis of bilateral carotid arteries: Secondary | ICD-10-CM | POA: Diagnosis not present

## 2020-06-24 MED ORDER — AMLODIPINE BESYLATE 2.5 MG PO TABS: 2.5000 mg | ORAL_TABLET | Freq: Every day | ORAL | 3 refills | Status: DC

## 2020-06-24 MED ORDER — AMLODIPINE BESYLATE 5 MG PO TABS: 0.5000 | ORAL_TABLET | Freq: Every day | ORAL | 2 refills | Status: DC

## 2020-06-24 NOTE — Patient Instructions (Signed)
Medication Instructions:  DECREASE amlodipine to 2.5 mg once daily  Please check your blood pressure at home twice daily, write it down.  Call the office or send message via Mychart with the readings in next week for Dr. Bjorn Pippin to review.   *If you need a refill on your cardiac medications before your next appointment, please call your pharmacy*   Lab Work: CMET, CBC, Lipid, BNP today  If you have labs (blood work) drawn today and your tests are completely normal, you will receive your results only by: MyChart Message (if you have MyChart) OR A paper copy in the mail If you have any lab test that is abnormal or we need to change your treatment, we will call you to review the results.  Follow-Up: At [facility], you and your health needs are our priority.  As part of our continuing mission to provide you with exceptional heart care, we have created designated Provider Care Teams.  These Care Teams include your primary Cardiologist (physician) and Advanced Practice Providers (APPs -  Physician Assistants and Nurse Practitioners) who all work together to provide you with the care you need, when you need it.  We recommend signing up for the patient portal called "MyChart".  Sign up information is provided on this After Visit Summary.  MyChart is used to connect with patients for Virtual Visits (Telemedicine).  Patients are able to view lab/test results, encounter notes, upcoming appointments, etc.  Non-urgent messages can be sent to your provider as well.   To learn more about what you can do with MyChart, go to [email or web address].    Your next appointment:   6 month(s)  The format for your next appointment:   In Person  Provider:   Epifanio Lesches, MD

## 2020-06-24 NOTE — Addendum Note (Signed)
Addended by: Johney Frame A on: 06/24/2020 09:51 AM   Modules accepted: Orders

## 2020-06-25 LAB — COMPREHENSIVE METABOLIC PANEL
ALT: 21 IU/L (ref 0–32)
AST: 23 IU/L (ref 0–40)
Albumin/Globulin Ratio: 2.4 — ABNORMAL HIGH (ref 1.2–2.2)
Albumin: 4.5 g/dL (ref 3.7–4.7)
Alkaline Phosphatase: 78 IU/L (ref 44–121)
BUN/Creatinine Ratio: 19 (ref 12–28)
BUN: 14 mg/dL (ref 8–27)
Bilirubin Total: 0.4 mg/dL (ref 0.0–1.2)
CO2: 27 mmol/L (ref 20–29)
Calcium: 9.5 mg/dL (ref 8.7–10.3)
Chloride: 98 mmol/L (ref 96–106)
Creatinine, Ser: 0.75 mg/dL (ref 0.57–1.00)
Globulin, Total: 1.9 g/dL (ref 1.5–4.5)
Glucose: 109 mg/dL — ABNORMAL HIGH (ref 65–99)
Potassium: 4.9 mmol/L (ref 3.5–5.2)
Sodium: 140 mmol/L (ref 134–144)
Total Protein: 6.4 g/dL (ref 6.0–8.5)
eGFR: 81 mL/min/{1.73_m2} (ref >59)

## 2020-06-25 LAB — LIPID PANEL
Chol/HDL Ratio: 2.1 ratio (ref 0.0–4.4)
Cholesterol, Total: 164 mg/dL (ref 100–199)
HDL: 80 mg/dL (ref >39)
LDL Chol Calc (NIH): 76 mg/dL (ref 0–99)
Triglycerides: 35 mg/dL (ref 0–149)
VLDL Cholesterol Cal: 8 mg/dL (ref 5–40)

## 2020-06-25 LAB — CBC
Hematocrit: 38 % (ref 34.0–46.6)
Hemoglobin: 12.6 g/dL (ref 11.1–15.9)
MCH: 33.3 pg — ABNORMAL HIGH (ref 26.6–33.0)
MCHC: 33.2 g/dL (ref 31.5–35.7)
MCV: 101 fL — ABNORMAL HIGH (ref 79–97)
Platelets: 79 10*3/uL — CL (ref 150–450)
RBC: 3.78 x10E6/uL (ref 3.77–5.28)
RDW: 11.5 % — ABNORMAL LOW (ref 11.7–15.4)
WBC: 7.8 10*3/uL (ref 3.4–10.8)

## 2020-06-25 LAB — BRAIN NATRIURETIC PEPTIDE: BNP: 52.1 pg/mL (ref 0.0–100.0)

## 2020-06-27 ENCOUNTER — Other Ambulatory Visit: Payer: Self-pay | Admitting: *Deleted

## 2020-06-27 DIAGNOSIS — D696 Thrombocytopenia, unspecified: Secondary | ICD-10-CM

## 2020-06-30 ENCOUNTER — Other Ambulatory Visit: Payer: Self-pay | Admitting: Cardiology

## 2020-07-01 ENCOUNTER — Telehealth: Payer: Self-pay | Admitting: Cardiology

## 2020-07-01 ENCOUNTER — Encounter (HOSPITAL_COMMUNITY): Payer: Self-pay

## 2020-07-01 ENCOUNTER — Encounter (HOSPITAL_COMMUNITY)
Admission: RE | Admit: 2020-07-01 | Discharge: 2020-07-01 | Disposition: A | Discharge status: Home / Self Care | Payer: Medicare Other | Source: Ambulatory Visit | Attending: Neurosurgery | Admitting: Neurosurgery

## 2020-07-01 ENCOUNTER — Other Ambulatory Visit: Payer: Self-pay

## 2020-07-01 DIAGNOSIS — I5032 Chronic diastolic (congestive) heart failure: Secondary | ICD-10-CM | POA: Diagnosis not present

## 2020-07-01 DIAGNOSIS — M4802 Spinal stenosis, cervical region: Secondary | ICD-10-CM | POA: Insufficient documentation

## 2020-07-01 DIAGNOSIS — I110 Hypertensive heart disease with heart failure: Secondary | ICD-10-CM | POA: Diagnosis not present

## 2020-07-01 DIAGNOSIS — I6523 Occlusion and stenosis of bilateral carotid arteries: Secondary | ICD-10-CM | POA: Insufficient documentation

## 2020-07-01 DIAGNOSIS — Z01812 Encounter for preprocedural laboratory examination: Secondary | ICD-10-CM | POA: Diagnosis not present

## 2020-07-01 HISTORY — DX: Hypothyroidism, unspecified: E03.9

## 2020-07-01 HISTORY — DX: Gastro-esophageal reflux disease without esophagitis: K21.9

## 2020-07-01 HISTORY — DX: Essential (primary) hypertension: I10

## 2020-07-01 LAB — CBC
HCT: 39.1 % (ref 36.0–46.0)
Hematocrit: 38.3 % (ref 34.0–46.6)
Hemoglobin: 12.6 g/dL (ref 11.1–15.9)
Hemoglobin: 12.8 g/dL (ref 12.0–15.0)
MCH: 33.3 pg — ABNORMAL HIGH (ref 26.6–33.0)
MCH: 33.2 pg (ref 26.0–34.0)
MCHC: 32.9 g/dL (ref 31.5–35.7)
MCHC: 32.7 g/dL (ref 30.0–36.0)
MCV: 101 fL — ABNORMAL HIGH (ref 79–97)
MCV: 101.6 fL — ABNORMAL HIGH (ref 80.0–100.0)
Platelets: 178 10*3/uL (ref 150–400)
RBC: 3.78 x10E6/uL (ref 3.77–5.28)
RBC: 3.85 MIL/uL — ABNORMAL LOW (ref 3.87–5.11)
RDW: 11.6 % — ABNORMAL LOW (ref 11.7–15.4)
RDW: 12.2 % (ref 11.5–15.5)
WBC: 5.7 10*3/uL (ref 3.4–10.8)
WBC: 8 10*3/uL (ref 4.0–10.5)
nRBC: 0 % (ref 0.0–0.2)

## 2020-07-01 LAB — TYPE AND SCREEN
ABO/RH(D): A NEG
Antibody Screen: NEGATIVE

## 2020-07-01 LAB — SPECIMEN STATUS REPORT

## 2020-07-01 LAB — SURGICAL PCR SCREEN
MRSA, PCR: NEGATIVE
Staphylococcus aureus: NEGATIVE

## 2020-07-01 NOTE — Telephone Encounter (Signed)
Patient called to say that her bp is high and she wanted to know if Dr. Bjorn Pippin want her to take the correct dosage of her medication. Patient does have an upcoming surgery. Please

## 2020-07-01 NOTE — Telephone Encounter (Signed)
  Returned call to patient who states that at her Pre op appointment today her blood pressure was 170/60 and repeat was 164/65. Patient states that since decreasing dosage of Amlodipine her BP has crept up. Patient states her blood pressure Monday was 140/61,  Tuesday 139/63, Wednesday 157/60- Patient states that her BP has been running higher at night/evening time. Patient reports she takes her Valsartan in the AM and Amlodipine 2.5mg  in the PM. Patient denies any symptoms at this time. Patient would like to know if she should increase her Amlodipine back to 5mg , patient is concerned about her blood pressure and upcoming surgery. Advised patient that I would forward her message to Dr. for him to review and advise. Patient verbalized understanding.

## 2020-07-01 NOTE — Progress Notes (Signed)
Surgical Instructions    Your procedure is scheduled on Wednesday July 6,2022.  Report to [facility] Main Entrance "A" at 8 A.M., then check in with the Admitting office.  Call this number if you have problems the morning of surgery:  [phone]   If you have any questions prior to your surgery date call [phone]: Open Monday-Friday 8am-4pm    Remember:  Do not eat or drink anything after midnight the night before your surgery      Take these medicines the morning of surgery with A SIP OF WATER:              amLODipine (NORVASC)              famotidine (PEPCID)if needed              gabapentin (NEURONTIN)              levothyroxine (SYNTHROID)              morphine (MSIR) if needed              omeprazole (PRILOSEC)              ondansetron (ZOFRAN) if needed           As of today, STOP taking any Aspirin (unless otherwise instructed by your surgeon) Aleve, Naproxen, Ibuprofen, Motrin, Advil, Goody's, BC's, all herbal medications, fish oil, and all vitamins.          Do not wear jewelry or makeup Do not wear lotions, powders, perfumes/colognes, or deodorant. Do not shave 48 hours prior to surgery.  Men may shave face and neck. Do not bring valuables to the hospital. DO Not wear nail polish, gel polish, artificial nails, or any other type of covering on  natural nails including finger and toenails. If patients have artificial nails, gel coating, etc. that need to be removed by a nail salon please have this removed prior to surgery or surgery may need to be canceled/delayed if the surgeon/ anesthesia feels like the patient is unable to be adequately monitored.             [facility] is not responsible for any belongings or valuables.  Do NOT Smoke (Tobacco/Vaping) or drink Alcohol 24 hours prior to your procedure If you use a CPAP at night, you may bring all equipment for your overnight stay.   Contacts, glasses, dentures or bridgework may not be worn into surgery, please  bring cases for these belongings   For patients admitted to the hospital, discharge time will be determined by your treatment team.   Patients discharged the day of surgery will not be allowed to drive home, and someone needs to stay with them for 24 hours.  ONLY 1 SUPPORT PERSON MAY BE PRESENT WHILE YOU ARE IN SURGERY. IF YOU ARE TO BE ADMITTED ONCE YOU ARE IN YOUR ROOM YOU WILL BE ALLOWED TWO (2) VISITORS.  Minor children may have two parents present. Special consideration for safety and communication needs will be reviewed on a case by case basis.  Special instructions:    Oral Hygiene is also important to reduce your risk of infection.  Remember - BRUSH YOUR TEETH THE MORNING OF SURGERY WITH YOUR REGULAR TOOTHPASTE   [place]- Preparing For Surgery  Before surgery, you can play an important role. Because skin is not sterile, your skin needs to be as free of germs as possible. You can reduce the number of germs on your  skin by washing with CHG (chlorahexidine gluconate) Soap before surgery.  CHG is an antiseptic cleaner which kills germs and bonds with the skin to continue killing germs even after washing.     Please do not use if you have an allergy to CHG or antibacterial soaps. If your skin becomes reddened/irritated stop using the CHG.  Do not shave (including legs and underarms) for at least 48 hours prior to first CHG shower. It is OK to shave your face.  Please follow these instructions carefully.     Shower the NIGHT BEFORE SURGERY and the MORNING OF SURGERY with CHG Soap.   If you chose to wash your hair, wash your hair first as usual with your normal shampoo. After you shampoo, rinse your hair and body thoroughly to remove the shampoo.  Then [employer] and genitals (private parts) with your normal soap and rinse thoroughly to remove soap.  After that Use CHG Soap as you would any other liquid soap. You can apply CHG directly to the skin and wash gently with a scrungie or a  clean washcloth.   Apply the CHG Soap to your body ONLY FROM THE NECK DOWN.  Do not use on open wounds or open sores. Avoid contact with your eyes, ears, mouth and genitals (private parts). Wash Face and genitals (private parts)  with your normal soap.   Wash thoroughly, paying special attention to the area where your surgery will be performed.  Thoroughly rinse your body with warm water from the neck down.  DO NOT shower/wash with your normal soap after using and rinsing off the CHG Soap.  Pat yourself dry with a CLEAN TOWEL.  Wear CLEAN PAJAMAS to bed the night before surgery  Place CLEAN SHEETS on your bed the night before your surgery  DO NOT SLEEP WITH PETS.   Day of Surgery:  Take a shower with CHG soap. Wear Clean/Comfortable clothing the morning of surgery Do not apply any deodorants/lotions.   Remember to brush your teeth WITH YOUR REGULAR TOOTHPASTE.   Please read over the following fact sheets that you were given.

## 2020-07-01 NOTE — Progress Notes (Signed)
PCP - Johny Blamer MD  Cardiologist - Dr. Epifanio Lesches  PPM/ICD - denies Device Orders -  Rep Notified -   Chest x-ray -none  EKG - 06/24/20 Stress Test - 04/2010 ECHO - 06/24/19 Cardiac Cath - none  Sleep Study - denies CPAP -   Fasting Blood Sugar - n/a Checks Blood Sugar _____ times a day  Blood Thinner Instructions:n/a Aspirin Instructions:n/a   COVID TEST- scheduled for Tuesday July 5   Anesthesia review:yes -cardiac history   Patient denies shortness of breath, fever, cough and chest pain at PAT appointment   All instructions explained to the patient, with a verbal understanding of the material. Patient agrees to go over the instructions while at home for a better understanding. Patient also instructed to self quarantine after being tested for COVID-19. The opportunity to ask questions was provided.

## 2020-07-04 NOTE — Telephone Encounter (Signed)
Would increase amlodipine back to 5 mg daily

## 2020-07-05 ENCOUNTER — Other Ambulatory Visit (HOSPITAL_COMMUNITY)
Admission: RE | Admit: 2020-07-05 | Discharge: 2020-07-05 | Disposition: A | Discharge status: Home / Self Care | Payer: Medicare Other | Source: Ambulatory Visit | Attending: Neurosurgery | Admitting: Neurosurgery

## 2020-07-05 DIAGNOSIS — Z01812 Encounter for preprocedural laboratory examination: Secondary | ICD-10-CM | POA: Insufficient documentation

## 2020-07-05 DIAGNOSIS — Z20822 Contact with and (suspected) exposure to covid-19: Secondary | ICD-10-CM | POA: Insufficient documentation

## 2020-07-05 LAB — SARS CORONAVIRUS 2 (TAT 6-24 HRS): SARS Coronavirus 2: NEGATIVE

## 2020-07-05 MED ORDER — AMLODIPINE BESYLATE 5 MG PO TABS: 5.0000 mg | ORAL_TABLET | Freq: Every day | ORAL | 3 refills | Status: DC

## 2020-07-05 NOTE — Anesthesia Preprocedure Evaluation (Addendum)
Anesthesia Evaluation  Patient identified by MRN, date of birth, ID band Patient awake    Reviewed: Allergy & Precautions, NPO status , Patient's Chart, lab work & pertinent test results  Airway Mallampati: II  TM Distance: >3 FB Neck ROM: Full    Dental   Many missing teeth, remaining teeth not loose per patient:   Pulmonary former smoker,    Pulmonary exam normal breath sounds clear to auscultation       Cardiovascular hypertension, Normal cardiovascular exam Rhythm:Regular Rate:Normal  Mild carotid stenosis on ASA. Stable, non-progressive  ECHO 06/24/19: Left ventricular ejection fraction, by estimation, is 65%. The left  ventricle has normal function. The left ventricle has no regional wall  motion abnormalities. Left ventricular diastolic parameters were normal.  2. Right ventricular systolic function is normal. The right ventricular  size is mildly enlarged. There is normal pulmonary artery systolic  pressure. The estimated right ventricular systolic pressure is 31.9 mmHg.  3. The mitral valve is normal in structure. Trivial mitral valve  regurgitation. No evidence of mitral stenosis.  4. The aortic valve is tricuspid. Aortic valve regurgitation is not  visualized. No aortic stenosis is present.  5. The inferior vena cava is normal in size with greater than 50%  respiratory variability, suggesting right atrial pressure of 3 mmHg.    Neuro/Psych negative neurological ROS  negative psych ROS   GI/Hepatic Neg liver ROS, GERD  ,  Endo/Other  Hypothyroidism   Renal/GU negative Renal ROS  negative genitourinary   Musculoskeletal  (+) Arthritis ,   Abdominal   Peds negative pediatric ROS (+)  Hematology negative hematology ROS (+)   Anesthesia Other Findings   Reproductive/Obstetrics negative OB ROS                         Anesthesia Physical Anesthesia Plan  ASA: 3  Anesthesia  Plan: General   Post-op Pain Management:    Induction: Intravenous  PONV Risk Score and Plan: 3  Airway Management Planned: Oral ETT and Video Laryngoscope Planned  Additional Equipment: None  Intra-op Plan:   Post-operative Plan: Extubation in OR  Informed Consent: I have reviewed the patients History and Physical, chart, labs and discussed the procedure including the risks, benefits and alternatives for the proposed anesthesia with the patient or authorized representative who has indicated his/her understanding and acceptance.     Dental advisory given  Plan Discussed with: CRNA and Anesthesiologist  Anesthesia Plan Comments: (PAT note by Antionette Poles, PA-C: Follows with cardiology for hx of HFpEF, HTN, Carotid stenosis (Carotid duplex on 06/16/2020 showed 1 to 39% right carotid stenosis, 40 to 59% left carotid stenosis). Last seen by Dr. Bjorn Pippin 06/24/20 for preop eval. Per note, "Preop evaluation: Prior to cervical spinal surgery. No symptoms to suggest active cardiac condition. Does report chest pain that she describes as constant burning pain across her upper chest, unrelated to exertion but worsened with palpation, suspect neurologic in origin from her cervical spinal stenosis. RCRI score 1. Functional capacity greater than 4 METS, reports can walk up a flight of stairs without chest pain or dyspnea. -No further cardiac work-up recommended prior to surgery -She has stopped taking daily aspirin, which she was taking for carotid stenosis. OK to continue to hold aspirin for surgery -Continue statin perioperatively.  Blood pressure noted to be elevated at preadmission testing appointment, Dr. Bjorn Pippin instructed patient to increase amlodipine from 2.5 to 5 mg daily.  She is also on valsartan  160 mg daily.  Preop labs reviewed, unremarkable.  EKG 06/24/2020: NSR.  Rate 83.  Septal infarct, age undetermined.  CHEST - 2 VIEW 04/08/2020: COMPARISON: Chest CT August 09, 2011  FINDINGS: There are scattered calcified granulomas. No edema or airspace opacity. Heart size and pulmonary vascularity are normal. No adenopathy. There is aortic atherosclerosis. There is screw and rod fixation throughout the thoracic and visualized upper lumbar region with thoracolumbar dextroscoliosis.  IMPRESSION: Scattered calcified granulomas. No edema or airspace opacity. Heart size normal. Extensive postoperative change in the thoracic and lumbar spine regions.  Carotid duplex 06/16/2020: Summary:  Right Carotid: Velocities in the right ICA are consistent with a 1-39% stenosis. Non-hemodynamically significant plaque <50% noted in the CCA.  Left Carotid: Velocities in the left ICA are consistent with a 40-59% stenosis. Non-hemodynamically significant plaque <50% noted in the CCA.  Vertebrals: Bilateral vertebral arteries demonstrate antegrade flow.  Subclavians: Normal flow hemodynamics were seen in bilateral subclavian arteries.   TTE 06/24/2019: 1. Left ventricular ejection fraction, by estimation, is 65%. The left  ventricle has normal function. The left ventricle has no regional wall  motion abnormalities. Left ventricular diastolic parameters were normal.  2. Right ventricular systolic function is normal. The right ventricular  size is mildly enlarged. There is normal pulmonary artery systolic  pressure. The estimated right ventricular systolic pressure is 31.9 mmHg.  3. The mitral valve is normal in structure. Trivial mitral valve  regurgitation. No evidence of mitral stenosis.  4. The aortic valve is tricuspid. Aortic valve regurgitation is not  visualized. No aortic stenosis is present.  5. The inferior vena cava is normal in size with greater than 50%  respiratory variability, suggesting right atrial pressure of 3 mmHg.    )      Anesthesia Quick Evaluation

## 2020-07-05 NOTE — Progress Notes (Signed)
Anesthesia Chart Review:  Follows with cardiology for hx of HFpEF, HTN, Carotid stenosis (Carotid duplex on 06/16/2020 showed 1 to 39% right carotid stenosis, 40 to 59% left carotid stenosis). Last seen by Dr. Bjorn Pippin 06/24/20 for preop eval. Per note, "Preop evaluation: Prior to cervical spinal surgery.  No symptoms to suggest active cardiac condition.  Does report chest pain that she describes as constant burning pain across her upper chest, unrelated to exertion but worsened with palpation, suspect neurologic in origin from her cervical spinal stenosis.  RCRI score 1.  Functional capacity greater than 4 METS, reports can walk up a flight of stairs without chest pain or dyspnea. -No further cardiac work-up recommended prior to surgery -She has stopped taking daily aspirin, which she was taking for carotid stenosis.  OK to continue to hold aspirin for surgery -Continue statin perioperatively.  Blood pressure noted to be elevated at preadmission testing appointment, Dr. Bjorn Pippin instructed patient to increase amlodipine from 2.5 to 5 mg daily.  She is also on valsartan 160 mg daily.  Preop labs reviewed, unremarkable.  EKG 06/24/2020: NSR.  Rate 83.  Septal infarct, age undetermined.  CHEST - 2 VIEW 04/08/2020: COMPARISON:  Chest CT August 09, 2011   FINDINGS: There are scattered calcified granulomas. No edema or airspace opacity. Heart size and pulmonary vascularity are normal. No adenopathy. There is aortic atherosclerosis. There is screw and rod fixation throughout the thoracic and visualized upper lumbar region with thoracolumbar dextroscoliosis.   IMPRESSION: Scattered calcified granulomas. No edema or airspace opacity. Heart size normal. Extensive postoperative change in the thoracic and lumbar spine regions.   Carotid duplex 06/16/2020: Summary:  Right Carotid: Velocities in the right ICA are consistent with a 1-39% stenosis. Non-hemodynamically significant plaque <50% noted in the CCA.   Left Carotid: Velocities in the left ICA are consistent with a 40-59% stenosis. Non-hemodynamically significant plaque <50% noted in the CCA.  Vertebrals:  Bilateral vertebral arteries demonstrate antegrade flow.  Subclavians: Normal flow hemodynamics were seen in bilateral subclavian arteries.   TTE 06/24/2019:  1. Left ventricular ejection fraction, by estimation, is 65%. The left  ventricle has normal function. The left ventricle has no regional wall  motion abnormalities. Left ventricular diastolic parameters were normal.   2. Right ventricular systolic function is normal. The right ventricular  size is mildly enlarged. There is normal pulmonary artery systolic  pressure. The estimated right ventricular systolic pressure is 31.9 mmHg.   3. The mitral valve is normal in structure. Trivial mitral valve  regurgitation. No evidence of mitral stenosis.   4. The aortic valve is tricuspid. Aortic valve regurgitation is not  visualized. No aortic stenosis is present.   5. The inferior vena cava is normal in size with greater than 50%  respiratory variability, suggesting right atrial pressure of 3 mmHg.    Zannie Cove [facility]/Anesthesiology Phone [phone] 07/05/2020 9:20 AM

## 2020-07-05 NOTE — Telephone Encounter (Signed)
Patient aware of recommendations and verbalized understanding.  She will continue to monitor BP and call back with questions/concerns.

## 2020-07-06 ENCOUNTER — Encounter (HOSPITAL_COMMUNITY): Payer: Self-pay | Admitting: Neurosurgery

## 2020-07-06 ENCOUNTER — Encounter (HOSPITAL_COMMUNITY)
Admission: RE | Disposition: A | Discharge status: Home / Self Care | Payer: Self-pay | Source: Home / Self Care | Attending: Neurosurgery

## 2020-07-06 ENCOUNTER — Other Ambulatory Visit: Payer: Self-pay

## 2020-07-06 ENCOUNTER — Ambulatory Visit (HOSPITAL_COMMUNITY): Payer: Medicare Other | Admitting: Anesthesiology

## 2020-07-06 ENCOUNTER — Ambulatory Visit (HOSPITAL_COMMUNITY)
Admission: RE | Admit: 2020-07-06 | Discharge: 2020-07-07 | Disposition: A | Discharge status: Home / Self Care | Payer: Medicare Other | Attending: Neurosurgery | Admitting: Neurosurgery

## 2020-07-06 ENCOUNTER — Ambulatory Visit (HOSPITAL_COMMUNITY): Payer: Medicare Other | Admitting: Vascular Surgery

## 2020-07-06 ENCOUNTER — Ambulatory Visit (HOSPITAL_COMMUNITY): Payer: Medicare Other

## 2020-07-06 DIAGNOSIS — Z79899 Other long term (current) drug therapy: Secondary | ICD-10-CM | POA: Insufficient documentation

## 2020-07-06 DIAGNOSIS — Z419 Encounter for procedure for purposes other than remedying health state, unspecified: Secondary | ICD-10-CM

## 2020-07-06 DIAGNOSIS — Z881 Allergy status to other antibiotic agents status: Secondary | ICD-10-CM | POA: Diagnosis not present

## 2020-07-06 DIAGNOSIS — M5001 Cervical disc disorder with myelopathy,  high cervical region: Secondary | ICD-10-CM | POA: Diagnosis not present

## 2020-07-06 DIAGNOSIS — Z79890 Hormone replacement therapy (postmenopausal): Secondary | ICD-10-CM | POA: Diagnosis not present

## 2020-07-06 DIAGNOSIS — Z91041 Radiographic dye allergy status: Secondary | ICD-10-CM | POA: Insufficient documentation

## 2020-07-06 DIAGNOSIS — Z888 Allergy status to other drugs, medicaments and biological substances status: Secondary | ICD-10-CM | POA: Insufficient documentation

## 2020-07-06 DIAGNOSIS — M4802 Spinal stenosis, cervical region: Secondary | ICD-10-CM | POA: Diagnosis present

## 2020-07-06 DIAGNOSIS — G992 Myelopathy in diseases classified elsewhere: Secondary | ICD-10-CM | POA: Diagnosis present

## 2020-07-06 DIAGNOSIS — I110 Hypertensive heart disease with heart failure: Secondary | ICD-10-CM | POA: Diagnosis not present

## 2020-07-06 DIAGNOSIS — Z880 Allergy status to penicillin: Secondary | ICD-10-CM | POA: Insufficient documentation

## 2020-07-06 DIAGNOSIS — Z791 Long term (current) use of non-steroidal anti-inflammatories (NSAID): Secondary | ICD-10-CM | POA: Diagnosis not present

## 2020-07-06 DIAGNOSIS — M5412 Radiculopathy, cervical region: Secondary | ICD-10-CM | POA: Diagnosis not present

## 2020-07-06 DIAGNOSIS — Z87891 Personal history of nicotine dependence: Secondary | ICD-10-CM | POA: Diagnosis not present

## 2020-07-06 DIAGNOSIS — Z8249 Family history of ischemic heart disease and other diseases of the circulatory system: Secondary | ICD-10-CM | POA: Insufficient documentation

## 2020-07-06 DIAGNOSIS — I509 Heart failure, unspecified: Secondary | ICD-10-CM | POA: Insufficient documentation

## 2020-07-06 DIAGNOSIS — E039 Hypothyroidism, unspecified: Secondary | ICD-10-CM | POA: Diagnosis not present

## 2020-07-06 HISTORY — PX: ANTERIOR CERVICAL DECOMP/DISCECTOMY FUSION: SHX1161

## 2020-07-06 LAB — ABO/RH: ABO/RH(D): A NEG

## 2020-07-06 SURGERY — ANTERIOR CERVICAL DECOMPRESSION/DISCECTOMY FUSION 1 LEVEL
Anesthesia: General | Site: Neck

## 2020-07-06 MED ORDER — ROCURONIUM BROMIDE 10 MG/ML (PF) SYRINGE
PREFILLED_SYRINGE | INTRAVENOUS | Status: DC | PRN
  Administered 2020-07-06: 10 mg via INTRAVENOUS
  Administered 2020-07-06: 60 mg via INTRAVENOUS

## 2020-07-06 MED ORDER — PHENYLEPHRINE HCL-NACL 10-0.9 MG/250ML-% IV SOLN
INTRAVENOUS | Status: AC
  Filled 2020-07-06: qty 500

## 2020-07-06 MED ORDER — BUPIVACAINE-EPINEPHRINE (PF) 0.5% -1:200000 IJ SOLN
INTRAMUSCULAR | Status: DC | PRN
  Administered 2020-07-06: 10 mL

## 2020-07-06 MED ORDER — LIDOCAINE 2% (20 MG/ML) 5 ML SYRINGE
INTRAMUSCULAR | Status: DC | PRN
  Administered 2020-07-06: 60 mg via INTRAVENOUS
  Administered 2020-07-06: 10 mg via INTRAVENOUS

## 2020-07-06 MED ORDER — LACTATED RINGERS IV SOLN: INTRAVENOUS | Status: DC

## 2020-07-06 MED ORDER — AMLODIPINE BESYLATE 5 MG PO TABS
5.0000 mg | ORAL_TABLET | Freq: Every day | ORAL | Status: DC
  Administered 2020-07-07: 5 mg via ORAL
  Filled 2020-07-06: qty 1

## 2020-07-06 MED ORDER — THROMBIN 5000 UNITS EX SOLR: OROMUCOSAL | Status: DC | PRN

## 2020-07-06 MED ORDER — VANCOMYCIN HCL IN DEXTROSE 1-5 GM/200ML-% IV SOLN: 1000.0000 mg | INTRAVENOUS | Status: AC

## 2020-07-06 MED ORDER — KETAMINE HCL 10 MG/ML IJ SOLN
INTRAMUSCULAR | Status: DC | PRN
  Administered 2020-07-06: 5 mg via INTRAVENOUS
  Administered 2020-07-06: 15 mg via INTRAVENOUS

## 2020-07-06 MED ORDER — ACETAMINOPHEN 500 MG PO TABS
ORAL_TABLET | ORAL | Status: AC
  Administered 2020-07-06: 1000 mg via ORAL
  Filled 2020-07-06: qty 2

## 2020-07-06 MED ORDER — OXYCODONE HCL 5 MG PO TABS: 5.0000 mg | ORAL_TABLET | ORAL | Status: DC | PRN

## 2020-07-06 MED ORDER — FENTANYL CITRATE (PF) 100 MCG/2ML IJ SOLN: 25.0000 ug | INTRAMUSCULAR | Status: DC | PRN

## 2020-07-06 MED ORDER — MORPHINE SULFATE (PF) 4 MG/ML IV SOLN
4.0000 mg | INTRAVENOUS | Status: DC | PRN
  Administered 2020-07-06 – 2020-07-07 (×3): 4 mg via INTRAVENOUS
  Filled 2020-07-06 (×3): qty 1

## 2020-07-06 MED ORDER — DEXAMETHASONE SODIUM PHOSPHATE 4 MG/ML IJ SOLN
4.0000 mg | Freq: Four times a day (QID) | INTRAMUSCULAR | Status: AC
  Administered 2020-07-06: 4 mg via INTRAVENOUS
  Filled 2020-07-06: qty 1

## 2020-07-06 MED ORDER — PHENYLEPHRINE HCL-NACL 10-0.9 MG/250ML-% IV SOLN
INTRAVENOUS | Status: DC | PRN
  Administered 2020-07-06: 50 ug/min via INTRAVENOUS

## 2020-07-06 MED ORDER — PROPOFOL 10 MG/ML IV BOLUS
INTRAVENOUS | Status: AC
  Filled 2020-07-06: qty 40

## 2020-07-06 MED ORDER — BACITRACIN ZINC 500 UNIT/GM EX OINT
TOPICAL_OINTMENT | CUTANEOUS | Status: AC
  Filled 2020-07-06: qty 28.35

## 2020-07-06 MED ORDER — ACETAMINOPHEN 500 MG PO TABS: 1000.0000 mg | ORAL_TABLET | Freq: Once | ORAL | Status: AC

## 2020-07-06 MED ORDER — ORAL CARE MOUTH RINSE: 15.0000 mL | Freq: Once | OROMUCOSAL | Status: AC

## 2020-07-06 MED ORDER — LEVOTHYROXINE SODIUM 75 MCG PO TABS
75.0000 ug | ORAL_TABLET | Freq: Every day | ORAL | Status: DC
  Administered 2020-07-07: 75 ug via ORAL
  Filled 2020-07-06: qty 1

## 2020-07-06 MED ORDER — ACETAMINOPHEN 650 MG RE SUPP: 650.0000 mg | RECTAL | Status: DC | PRN

## 2020-07-06 MED ORDER — FENTANYL CITRATE (PF) 250 MCG/5ML IJ SOLN
INTRAMUSCULAR | Status: DC | PRN
  Administered 2020-07-06 (×3): 50 ug via INTRAVENOUS

## 2020-07-06 MED ORDER — EPHEDRINE SULFATE-NACL 50-0.9 MG/10ML-% IV SOSY
PREFILLED_SYRINGE | INTRAVENOUS | Status: DC | PRN
  Administered 2020-07-06 (×2): 10 mg via INTRAVENOUS

## 2020-07-06 MED ORDER — PROMETHAZINE HCL 25 MG/ML IJ SOLN: 6.2500 mg | INTRAMUSCULAR | Status: DC | PRN

## 2020-07-06 MED ORDER — VANCOMYCIN HCL 500 MG/100ML IV SOLN
500.0000 mg | Freq: Once | INTRAVENOUS | Status: AC
  Administered 2020-07-07: 500 mg via INTRAVENOUS
  Filled 2020-07-06: qty 100

## 2020-07-06 MED ORDER — ALUM & MAG HYDROXIDE-SIMETH 200-200-20 MG/5ML PO SUSP: 30.0000 mL | Freq: Four times a day (QID) | ORAL | Status: DC | PRN

## 2020-07-06 MED ORDER — 0.9 % SODIUM CHLORIDE (POUR BTL) OPTIME
TOPICAL | Status: DC | PRN
  Administered 2020-07-06: 1000 mL

## 2020-07-06 MED ORDER — BUPIVACAINE HCL (PF) 0.5 % IJ SOLN
INTRAMUSCULAR | Status: AC
  Filled 2020-07-06: qty 30

## 2020-07-06 MED ORDER — CHLORHEXIDINE GLUCONATE 0.12 % MT SOLN: 15.0000 mL | Freq: Once | OROMUCOSAL | Status: AC

## 2020-07-06 MED ORDER — CYCLOBENZAPRINE HCL 10 MG PO TABS: 10.0000 mg | ORAL_TABLET | Freq: Three times a day (TID) | ORAL | Status: DC | PRN

## 2020-07-06 MED ORDER — PHENYLEPHRINE HCL (PRESSORS) 10 MG/ML IV SOLN
INTRAVENOUS | Status: DC | PRN
  Administered 2020-07-06: 80 ug via INTRAVENOUS

## 2020-07-06 MED ORDER — DOCUSATE SODIUM 100 MG PO CAPS
100.0000 mg | ORAL_CAPSULE | Freq: Two times a day (BID) | ORAL | Status: DC
  Administered 2020-07-06 – 2020-07-07 (×2): 100 mg via ORAL
  Filled 2020-07-06 (×2): qty 1

## 2020-07-06 MED ORDER — ONDANSETRON HCL 4 MG/2ML IJ SOLN: 4.0000 mg | Freq: Four times a day (QID) | INTRAMUSCULAR | Status: DC | PRN

## 2020-07-06 MED ORDER — CHLORHEXIDINE GLUCONATE 0.12 % MT SOLN
OROMUCOSAL | Status: AC
  Administered 2020-07-06: 15 mL via OROMUCOSAL
  Filled 2020-07-06: qty 15

## 2020-07-06 MED ORDER — PHENOL 1.4 % MT LIQD: 1.0000 | OROMUCOSAL | Status: DC | PRN

## 2020-07-06 MED ORDER — ONDANSETRON HCL 4 MG PO TABS: 4.0000 mg | ORAL_TABLET | Freq: Three times a day (TID) | ORAL | Status: DC | PRN

## 2020-07-06 MED ORDER — IRBESARTAN 150 MG PO TABS
150.0000 mg | ORAL_TABLET | Freq: Every day | ORAL | Status: DC
  Administered 2020-07-07: 150 mg via ORAL
  Filled 2020-07-06: qty 1

## 2020-07-06 MED ORDER — ONDANSETRON HCL 4 MG/2ML IJ SOLN
INTRAMUSCULAR | Status: DC | PRN
  Administered 2020-07-06: 4 mg via INTRAVENOUS

## 2020-07-06 MED ORDER — CHLORHEXIDINE GLUCONATE CLOTH 2 % EX PADS
6.0000 | MEDICATED_PAD | Freq: Once | CUTANEOUS | Status: DC
  Administered 2020-07-06: 6 via TOPICAL

## 2020-07-06 MED ORDER — OXYCODONE HCL 5 MG PO TABS
10.0000 mg | ORAL_TABLET | ORAL | Status: DC | PRN
  Administered 2020-07-07 (×2): 10 mg via ORAL
  Filled 2020-07-06 (×4): qty 2

## 2020-07-06 MED ORDER — BACITRACIN ZINC 500 UNIT/GM EX OINT
TOPICAL_OINTMENT | CUTANEOUS | Status: DC | PRN
  Administered 2020-07-06: 1 via TOPICAL

## 2020-07-06 MED ORDER — PROPOFOL 10 MG/ML IV BOLUS
INTRAVENOUS | Status: DC | PRN
  Administered 2020-07-06: 20 mg via INTRAVENOUS
  Administered 2020-07-06: 150 mg via INTRAVENOUS

## 2020-07-06 MED ORDER — OXYCODONE HCL 5 MG/5ML PO SOLN
5.0000 mg | Freq: Once | ORAL | Status: DC | PRN
Start: 2020-07-06 — End: 2020-07-06

## 2020-07-06 MED ORDER — THROMBIN 5000 UNITS EX SOLR
CUTANEOUS | Status: AC
  Filled 2020-07-06: qty 5000

## 2020-07-06 MED ORDER — ZOLPIDEM TARTRATE 5 MG PO TABS: 5.0000 mg | ORAL_TABLET | Freq: Every evening | ORAL | Status: DC | PRN

## 2020-07-06 MED ORDER — ACETAMINOPHEN 325 MG PO TABS
650.0000 mg | ORAL_TABLET | ORAL | Status: DC | PRN
  Administered 2020-07-07 (×2): 650 mg via ORAL
  Filled 2020-07-06 (×2): qty 2

## 2020-07-06 MED ORDER — PANTOPRAZOLE SODIUM 40 MG PO TBEC
80.0000 mg | DELAYED_RELEASE_TABLET | Freq: Every day | ORAL | Status: DC
  Administered 2020-07-07: 80 mg via ORAL
  Filled 2020-07-06: qty 2

## 2020-07-06 MED ORDER — VANCOMYCIN HCL IN DEXTROSE 1-5 GM/200ML-% IV SOLN
INTRAVENOUS | Status: AC
  Administered 2020-07-06: 1000 mg via INTRAVENOUS
  Filled 2020-07-06: qty 200

## 2020-07-06 MED ORDER — DEXAMETHASONE 4 MG PO TABS
4.0000 mg | ORAL_TABLET | Freq: Four times a day (QID) | ORAL | Status: AC
  Administered 2020-07-06: 4 mg via ORAL
  Filled 2020-07-06: qty 1

## 2020-07-06 MED ORDER — KETAMINE HCL 50 MG/5ML IJ SOSY
PREFILLED_SYRINGE | INTRAMUSCULAR | Status: AC
  Filled 2020-07-06: qty 5

## 2020-07-06 MED ORDER — PANTOPRAZOLE SODIUM 40 MG IV SOLR: 40.0000 mg | Freq: Every day | INTRAVENOUS | Status: DC

## 2020-07-06 MED ORDER — GABAPENTIN 300 MG PO CAPS
300.0000 mg | ORAL_CAPSULE | Freq: Three times a day (TID) | ORAL | Status: DC
  Administered 2020-07-06 – 2020-07-07 (×3): 300 mg via ORAL
  Filled 2020-07-06 (×3): qty 1

## 2020-07-06 MED ORDER — FAMOTIDINE 20 MG PO TABS: 20.0000 mg | ORAL_TABLET | Freq: Every day | ORAL | Status: DC | PRN

## 2020-07-06 MED ORDER — SUGAMMADEX SODIUM 500 MG/5ML IV SOLN
INTRAVENOUS | Status: DC | PRN
  Administered 2020-07-06: 120 mg via INTRAVENOUS

## 2020-07-06 MED ORDER — OXYCODONE HCL 5 MG PO TABS: 5.0000 mg | ORAL_TABLET | Freq: Once | ORAL | Status: DC | PRN

## 2020-07-06 MED ORDER — DEXAMETHASONE SODIUM PHOSPHATE 10 MG/ML IJ SOLN
INTRAMUSCULAR | Status: DC | PRN
  Administered 2020-07-06: 10 mg via INTRAVENOUS

## 2020-07-06 MED ORDER — MENTHOL 3 MG MT LOZG: 1.0000 | LOZENGE | OROMUCOSAL | Status: DC | PRN

## 2020-07-06 MED ORDER — ONDANSETRON HCL 4 MG PO TABS: 4.0000 mg | ORAL_TABLET | Freq: Four times a day (QID) | ORAL | Status: DC | PRN

## 2020-07-06 MED ORDER — ROSUVASTATIN CALCIUM 20 MG PO TABS
20.0000 mg | ORAL_TABLET | Freq: Every day | ORAL | Status: DC
  Administered 2020-07-06: 20 mg via ORAL
  Filled 2020-07-06: qty 1

## 2020-07-06 MED ORDER — FENTANYL CITRATE (PF) 250 MCG/5ML IJ SOLN
INTRAMUSCULAR | Status: AC
  Filled 2020-07-06: qty 5

## 2020-07-06 MED ORDER — BISACODYL 10 MG RE SUPP: 10.0000 mg | Freq: Every day | RECTAL | Status: DC | PRN

## 2020-07-06 MED ORDER — ACETAMINOPHEN 500 MG PO TABS
1000.0000 mg | ORAL_TABLET | Freq: Four times a day (QID) | ORAL | Status: DC
  Administered 2020-07-06 (×2): 1000 mg via ORAL
  Filled 2020-07-06 (×3): qty 2

## 2020-07-06 MED ORDER — OXYBUTYNIN CHLORIDE ER 5 MG PO TB24
5.0000 mg | ORAL_TABLET | Freq: Every day | ORAL | Status: DC
  Filled 2020-07-06: qty 1

## 2020-07-06 SURGICAL SUPPLY — 51 items
APL SKNCLS STERI-STRIP NONHPOA (GAUZE/BANDAGES/DRESSINGS) ×1
BAG COUNTER SPONGE SURGICOUNT (BAG) ×3 IMPLANT
BAG SPNG CNTER NS LX DISP (BAG) ×2
BENZOIN TINCTURE PRP APPL 2/3 (GAUZE/BANDAGES/DRESSINGS) ×2 IMPLANT
BIT DRILL NEURO 2X3.1 SFT TUCH (MISCELLANEOUS) ×1 IMPLANT
BLADE SURG 15 STRL LF DISP TIS (BLADE) ×1 IMPLANT
BLADE SURG 15 STRL SS (BLADE) ×2
BLADE ULTRA TIP 2M (BLADE) ×2 IMPLANT
BUR BARREL STRAIGHT FLUTE 4.0 (BURR) ×2 IMPLANT
BUR MATCHSTICK NEURO 3.0 LAGG (BURR) ×2 IMPLANT
CANISTER SUCT 3000ML PPV (MISCELLANEOUS) ×2 IMPLANT
CARTRIDGE OIL MAESTRO DRILL (MISCELLANEOUS) ×1 IMPLANT
COVER MAYO STAND STRL (DRAPES) ×2 IMPLANT
DIFFUSER DRILL AIR PNEUMATIC (MISCELLANEOUS) ×2 IMPLANT
DRAPE LAPAROTOMY 100X72 PEDS (DRAPES) ×2 IMPLANT
DRAPE SURG 17X23 STRL (DRAPES) ×4 IMPLANT
DRILL NEURO 2X3.1 SOFT TOUCH (MISCELLANEOUS) ×2
DRSG OPSITE POSTOP 3X4 (GAUZE/BANDAGES/DRESSINGS) ×2 IMPLANT
ELECT REM PT RETURN 9FT ADLT (ELECTROSURGICAL) ×2
ELECTRODE REM PT RTRN 9FT ADLT (ELECTROSURGICAL) ×1 IMPLANT
GAUZE 4X4 16PLY ~~LOC~~+RFID DBL (SPONGE) ×1 IMPLANT
GLOVE SURG ENC MOIS LTX SZ8 (GLOVE) ×3 IMPLANT
GLOVE SURG ENC MOIS LTX SZ8.5 (GLOVE) ×3 IMPLANT
GOWN STRL REUS W/ TWL LRG LVL3 (GOWN DISPOSABLE) IMPLANT
GOWN STRL REUS W/ TWL XL LVL3 (GOWN DISPOSABLE) ×1 IMPLANT
GOWN STRL REUS W/TWL LRG LVL3 (GOWN DISPOSABLE) ×4
GOWN STRL REUS W/TWL XL LVL3 (GOWN DISPOSABLE) ×4
HEMOSTAT POWDER KIT SURGIFOAM (HEMOSTASIS) ×2 IMPLANT
KIT BASIN OR (CUSTOM PROCEDURE TRAY) ×2 IMPLANT
KIT TURNOVER KIT B (KITS) ×2 IMPLANT
MARKER SKIN DUAL TIP RULER LAB (MISCELLANEOUS) ×2 IMPLANT
NDL SPNL 18GX3.5 QUINCKE PK (NEEDLE) ×1 IMPLANT
NEEDLE HYPO 22GX1.5 SAFETY (NEEDLE) ×2 IMPLANT
NEEDLE SPNL 18GX3.5 QUINCKE PK (NEEDLE) ×2 IMPLANT
NS IRRIG 1000ML POUR BTL (IV SOLUTION) ×2 IMPLANT
OIL CARTRIDGE MAESTRO DRILL (MISCELLANEOUS) ×2
PACK LAMINECTOMY NEURO (CUSTOM PROCEDURE TRAY) ×2 IMPLANT
PEEK S VISTA 7X11X14 (Peek) ×1 IMPLANT
PIN DISTRACTION 14MM (PIN) ×4 IMPLANT
PLATE ANT CERV XTEND ELD 1 L12 (Plate) ×1 IMPLANT
PUTTY DBM 2CC CALC GRAN (Putty) ×1 IMPLANT
SCREW XTD VAR 4.2 SELF TAP 12 (Screw) ×4 IMPLANT
SPONGE INTESTINAL PEANUT (DISPOSABLE) ×4 IMPLANT
SPONGE T-LAP 4X18 ~~LOC~~+RFID (SPONGE) ×1 IMPLANT
STRIP CLOSURE SKIN 1/2X4 (GAUZE/BANDAGES/DRESSINGS) ×2 IMPLANT
SUT VIC AB 0 CT1 27 (SUTURE) ×2
SUT VIC AB 0 CT1 27XBRD ANTBC (SUTURE) ×1 IMPLANT
SUT VIC AB 3-0 SH 8-18 (SUTURE) ×2 IMPLANT
TOWEL GREEN STERILE (TOWEL DISPOSABLE) ×2 IMPLANT
TOWEL GREEN STERILE FF (TOWEL DISPOSABLE) ×2 IMPLANT
WATER STERILE IRR 1000ML POUR (IV SOLUTION) ×2 IMPLANT

## 2020-07-06 NOTE — Anesthesia Procedure Notes (Signed)
Procedure Name: Intubation Date/Time: 07/06/2020 10:24 AM Performed by: Stanton Kidney, CRNA Pre-anesthesia Checklist: Patient identified, Patient being monitored, Timeout performed, Emergency Drugs available and Suction available Patient Re-evaluated:Patient Re-evaluated prior to induction Oxygen Delivery Method: Circle system utilized Preoxygenation: Pre-oxygenation with 100% oxygen Induction Type: IV induction Ventilation: Mask ventilation without difficulty Laryngoscope Size: 3 and Glidescope Grade View: Grade I Tube type: Oral Tube size: 7.0 mm Number of attempts: 1 Airway Equipment and Method: Stylet Placement Confirmation: ETT inserted through vocal cords under direct vision, positive ETCO2 and breath sounds checked- equal and bilateral Secured at: 21 cm Tube secured with: Tape Dental Injury: Teeth and Oropharynx as per pre-operative assessment

## 2020-07-06 NOTE — Op Note (Signed)
Brief history: The patient is a 80 year old white female who is complained of neck pain with bilateral arm pain, numbness, tingling and weakness.  She has failed medical management.  She was worked up with a cervical MRI which demonstrated severe stenosis and herniated disc at C3-4.  I recommended surgery.  The patient is proceed with the operation after weighing the risks, benefits and alternatives.  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 Delma Officer  Asst.: Dr. Coletta Memos  Anesthesia: Gen. endotracheal  Estimated blood loss: 75 cc  Drains: None  Complications: None  Description of procedure: The patient was brought to the operating room by the anesthesia team. General endotracheal anesthesia was induced. A roll was placed under the patient's shoulders to keep the neck in the neutral position. The patient's anterior cervical region was then prepared with Betadine scrub and Betadine solution. Sterile drapes were applied.  The area to be incised was then injected with Marcaine with epinephrine solution. I then used a scalpel to make a transverse incision in the patient's left anterior neck. I used the Metzenbaum scissors to divide the platysmal muscle and then to dissect medial to the sternocleidomastoid muscle, jugular vein, and carotid artery. I carefully dissected down towards the anterior cervical spine identifying the esophagus and retracting it medially. Then using Kitner swabs to clear soft tissue from the anterior cervical spine. We then inserted a bent spinal needle into  the upper exposed intervertebral disc space. We then obtained intraoperative radiographs confirm our location.  I then used electrocautery to detach the medial border of the longus colli muscle bilaterally from the C3-4 intervertebral disc spaces. I then inserted the Caspar self-retaining retractor underneath the longus colli muscle bilaterally to provide exposure.  We then incised the intervertebral disc at C3-4. We then performed a partial intervertebral discectomy with a pituitary forceps and the Karlin curettes. I then inserted distraction screws into the vertebral bodies at C3 and C4. We then distracted the interspace. We then used the high-speed drill to decorticate the vertebral endplates at C3-4, to drill away the remainder of the intervertebral disc, to drill away some posterior spondylosis, and to thin out the posterior longitudinal ligament. I then incised ligament with the arachnoid knife. We then removed the ligament with a Kerrison punches undercutting the vertebral endplates and decompressing the thecal sac. We then performed foraminotomies about the bilateral C4 nerve roots. This completed the decompression at this level.  We now turned our to attention to the interbody fusion. We used the trial spacers to determine the appropriate size for the interbody prosthesis. We then pre-filled prosthesis with a combination of local morcellized autograft bone that we obtained during decompression as well as Zimmer DBM. We then inserted the prosthesis into the distracted interspace at C3-4. We then removed the distraction screws. There was a good snug fit of the prosthesis in the interspace.  Having completed the fusion we now turned attention to the anterior spinal instrumentation. We used the high-speed drill to drill away some anterior spondylosis at the disc spaces so that the plate lay down flat. We selected the appropriate length titanium anterior cervical plate. We laid it along the anterior  aspect of the vertebral bodies  from C3-4. We then drilled two yesterday 12 mm holes at C3 and C4. We then secured the plate to the vertebral bodies by placing two 12 mm self-tapping screws at C3 and C4. We then obtained intraoperative radiograph. The demonstrating good position of the instrumentation. We therefore secured the screws the plate the locking each cam. This completed the instrumentation.  We then obtained hemostasis using bipolar electrocautery. We irrigated the wound out with bacitracin solution. We then removed the retractor. We inspected the esophagus for any damage. There was none apparent. We then reapproximated patient's platysmal muscle with interrupted 3-0 Vicryl suture. We then reapproximated the subcutaneous tissue with interrupted 3-0 Vicryl suture. The skin was reapproximated with Steri-Strips and benzoin. The wound was then covered with bacitracin ointment. A sterile dressing was applied. The drapes were removed. Patient was subsequently extubated by the anesthesia team and transported to the post anesthesia care unit in stable condition. All sponge instrument and needle counts were reportedly correct at the end of this case.

## 2020-07-06 NOTE — Progress Notes (Signed)
Orthopedic Tech Progress Note Patient Details:  Tabitha Wade 05/02/1940 [id number] Neck brace given to patient's nurse for application  Patient ID: [name], female   DOB: 1940-04-12, 80 y.o.   MRN: [id number]  [name] 07/06/2020, 2:16 PM

## 2020-07-06 NOTE — H&P (Signed)
Subjective: The patient is a 80 year old white female who has complained of neck pain with bilateral arm pain numbness tingling and weakness.  She was worked up with a cervical MRI which demonstrated severe stenosis at C3-4.  I recommended surgery.  The patient has decided proceed with the operation.  Past Medical History:  Diagnosis Date   CHF (congestive heart failure) (HCC)    GERD (gastroesophageal reflux disease)    Hypertension    Hypothyroidism     Past Surgical History:  Procedure Laterality Date   BACK SURGERY     3 back surgery, 2004, 2006   INTRACAPSULAR CATARACT EXTRACTION     TUBAL LIGATION      Allergies  Allergen Reactions   Contrast Media [Iodinated Diagnostic Agents]     Pt received 13 hr prep , hx of prev  reaction , patient unsure  If intravenous vs topical- stated told that if allergic to topical could be allergic to iv   Metoprolol Tartrate Shortness Of Breath   Penicillins Shortness Of Breath   Cephalexin Hives and Hypertension   Neoporacin [Bacitracin-Neomycin-Polymyxin]    Omnicef [Cefdinir]     Stabbing pain in back and upset stomach   Oxybutynin Chloride Swelling    Social History   Tobacco Use   Smoking status: Former    Pack years: 0.00    Types: Cigarettes    Quit date: 08/29/1958    Years since quitting: 61.8   Smokeless tobacco: Former  Substance Use Topics   Alcohol use: No    Family History  Problem Relation Age of Onset   Heart attack Mother    Heart attack Father    Heart disease Sister        cabg   Heart disease Sister        cabg   Stroke Sister        cabg   Prior to Admission medications   Medication Sig Start Date End Date Taking? Authorizing Provider  amLODipine (NORVASC) 5 MG tablet Take 1 tablet (5 mg total) by mouth daily. 07/05/20 06/30/21 Yes Little Ishikawa, MD  Ascorbic Acid (VITAMIN C) 1000 MG tablet Take 1,000 mg by mouth daily.   Yes [provider]  CALCIUM PO Take 500 mg by mouth 3 (three) times  daily.   Yes [provider]  famotidine (PEPCID) 20 MG tablet Take 20 mg by mouth daily as needed for heartburn or indigestion.   Yes [provider]  gabapentin (NEURONTIN) 300 MG capsule Take 300 mg by mouth 3 (three) times daily.   Yes [provider]  glucosamine-chondroitin 500-400 MG tablet Take 1 tablet by mouth daily.   Yes [provider]  levothyroxine (SYNTHROID) 75 MCG tablet Take 75 mcg by mouth daily before breakfast.   Yes [provider]  meloxicam (MOBIC) 7.5 MG tablet Take 7.5 mg by mouth in the morning.   Yes [provider]  morphine (MSIR) 15 MG tablet Take 15 mg by mouth every 12 (twelve) hours as needed for severe pain (for pain).   Yes [provider]  Multiple Vitamin (MULTI-VITAMIN PO) Take 1 tablet by mouth daily.   Yes [provider]  omeprazole (PRILOSEC) 40 MG capsule Take 40 mg by mouth daily before breakfast.   Yes [provider]  ondansetron (ZOFRAN) 4 MG tablet Take 4 mg by mouth every 8 (eight) hours as needed for nausea or vomiting.   Yes [provider]  oxybutynin (DITROPAN-XL) 5 MG 24  hr tablet Take 5 mg by mouth at bedtime.   Yes [provider]  rosuvastatin (CRESTOR) 20 MG tablet Take 20 mg by mouth at bedtime.   Yes [provider]  sulfaSALAzine (AZULFIDINE) 500 MG tablet Take 500 mg by mouth at bedtime.   Yes [provider]  Turmeric 500 MG CAPS Take 1,000 mg by mouth in the morning and at bedtime.   Yes [provider]  valsartan (DIOVAN) 160 MG tablet Take 1 tablet by mouth once daily 07/01/20  Yes Little Ishikawa, MD  traMADol (ULTRAM) 50 MG tablet Take 50 mg by mouth every 6 (six) hours as needed (for pain).    [provider]     Review of Systems  Positive ROS: As above  All other systems have been reviewed and were otherwise negative with the exception of those mentioned in the HPI and as  above.  Objective: Vital signs in last 24 hours: Temp:  [98.1 F (36.7 C)] 98.1 F (36.7 C) (07/06 0827) Pulse Rate:  [80] 80 (07/06 0827) Resp:  [20] 20 (07/06 0827) BP: (152)/(54) 152/54 (07/06 0827) SpO2:  [96 %] 96 % (07/06 0827) Weight:  [48.5 kg] 48.5 kg (07/06 0827) Estimated body mass index is 19.57 kg/m as calculated from the following:   Height as of this encounter: 5\' 2"  (1.575 m).   Weight as of this encounter: 48.5 kg.   General Appearance: Alert Head: Normocephalic, without obvious abnormality, atraumatic Eyes: PERRL, conjunctiva/corneas clear, EOM's intact,    Ears: Normal  Throat: Normal  Neck: Supple, Back: unremarkable Lungs: Clear to auscultation bilaterally, respirations unlabored Heart: Regular rate and rhythm, no murmur, rub or gallop Abdomen: Soft, non-tender Extremities: Extremities normal, atraumatic, no cyanosis or edema Skin: unremarkable  NEUROLOGIC:   Mental status: alert and oriented,Motor Exam -she has weakness in her bilateral arms.  Sensory Exam -she has numbness and tingling in bilateral upper extremities and hands. Reflexes: The patient is hyperreflexic in her lower extremities. Coordination - grossly normal Gait - grossly normal Balance - grossly normal Cranial Nerves: I: smell Not tested  II: visual acuity  OS: Normal  OD: Normal   II: visual fields Full to confrontation  II: pupils Equal, round, reactive to light  III,VII: ptosis None  III,IV,VI: extraocular muscles  Full ROM  V: mastication Normal  V: facial light touch sensation  Normal  V,VII: corneal reflex  Present  VII: facial muscle function - upper  Normal  VII: facial muscle function - lower Normal  VIII: hearing Not tested  IX: soft palate elevation  Normal  IX,X: gag reflex Present  XI: trapezius strength  5/5  XI: sternocleidomastoid strength 5/5  XI: neck flexion strength  5/5  XII: tongue strength  Normal    Data Review Lab Results  Component Value Date    WBC 8.0 07/01/2020   HGB 12.8 07/01/2020   HCT 39.1 07/01/2020   MCV 101.6 (H) 07/01/2020   PLT 178 07/01/2020   Lab Results  Component Value Date   NA 140 06/24/2020   K 4.9 06/24/2020   CL 98 06/24/2020   CO2 27 06/24/2020   BUN 14 06/24/2020   CREATININE 0.75 06/24/2020   GLUCOSE 109 (H) 06/24/2020   No results found for: INR, PROTIME  Assessment/Plan: 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  I reviewed her imaging studies with her and pointed out the abnormalities.  We have discussed the various  treatment options including surgery.  I have described the surgical treatment option of a C3-4 anterior cervicectomy, fusion and plating.  I have shown her surgical models.  I have given her a surgical pamphlet.  We have discussed the risk, benefits, alternatives, expected postoperative course, and likelihood of achieving our goals with surgery.  I have answered all her questions.  She has decided proceed with surgery.   Cristi Loron 07/06/2020 10:08 AM

## 2020-07-06 NOTE — Progress Notes (Signed)
Subjective: The patient is alert and pleasant.  She looks well.  She is in no apparent distress.  Objective: Vital signs in last 24 hours: Temp:  [97.6 F (36.4 C)-98.1 F (36.7 C)] 97.6 F (36.4 C) (07/06 1250) Pulse Rate:  [80-99] 99 (07/06 1305) Resp:  [13-20] 13 (07/06 1305) BP: (147-171)/(54-59) 147/57 (07/06 1305) SpO2:  [94 %-98 %] 94 % (07/06 1305) Weight:  [48.5 kg] 48.5 kg (07/06 0827) Estimated body mass index is 19.57 kg/m as calculated from the following:   Height as of this encounter: 5\' 2"  (1.575 m).   Weight as of this encounter: 48.5 kg.   Intake/Output from previous day: No intake/output data recorded. Intake/Output this shift: Total I/O In: 950 [I.V.:700; Other:50; IV Piggyback:200] Out: 50 [Blood:50]  Physical exam the patient is alert and pleasant.  She is moving all 4 extremities well.  The dressing has a small bloodstain.  There is no hematoma or shift.  Lab Results: No results for input(s): WBC, HGB, HCT, PLT in the last 72 hours. BMET No results for input(s): NA, K, CL, CO2, GLUCOSE, BUN, CREATININE, CALCIUM in the last 72 hours.  Studies/Results: No results found.  Assessment/Plan: The patient is doing well.  I spoke with her daughter.  She would likely go home tomorrow.  LOS: 0 days     07/06/2020, 1:26 PM

## 2020-07-06 NOTE — Plan of Care (Signed)
  Problem: Education: Goal: Knowledge of the prescribed therapeutic regimen will improve Outcome: Progressing   Problem: Clinical Measurements: Goal: Postoperative complications will be avoided or minimized Outcome: Progressing   

## 2020-07-06 NOTE — Transfer of Care (Signed)
Immediate Anesthesia Transfer of Care Note  Patient: Tabitha Wade  Procedure(s) Performed: CERVICAL THREE-FOUR ANTERIOR CERVICAL DECOMPRESSION/DISCECTOMY FUSION (Neck)  Patient Location: PACU  Anesthesia Type:General  Level of Consciousness: awake, alert , oriented, patient cooperative and responds to stimulation  Airway & Oxygen Therapy: Patient Spontanous Breathing and Patient connected to face mask oxygen  Post-op Assessment: Report given to RN and Post -op Vital signs reviewed and stable  Post vital signs: Reviewed and stable  Last Vitals:  Vitals Value Taken Time  BP    Temp    Pulse    Resp    SpO2      Last Pain:  Vitals:   07/06/20 0833  TempSrc:   PainSc: 4       Patients Stated Pain Goal: 2 (07/06/20 5638)  Complications: No notable events documented.

## 2020-07-06 NOTE — Progress Notes (Signed)
Pharmacy Antibiotic Note  Tabitha Wade is a 80 y.o. female admitted on 07/06/2020 with surgical prophylaxis.  Pharmacy has been consulted for vancomycin dosing.  Confirmed no drain per RN and op note.  Patient received 20 mg/kg at 9am. ClCr ~46ml/min.   Plan: Vancomycin 500mg  x1   Height: 5\' 2"  (157.5 cm) Weight: 48.5 kg (107 lb) IBW/kg (Calculated) : 50.1  Temp (24hrs), Avg:97.8 F (36.6 C), Min:97.6 F (36.4 C), Max:98.1 F (36.7 C)  Recent Labs  Lab 06/30/20 0000 07/01/20 1208  WBC 5.7 8.0    Estimated Creatinine Clearance: 44.4 mL/min (by C-G formula based on SCr of 0.75 mg/dL).    Allergies  Allergen Reactions   Contrast Media [Iodinated Diagnostic Agents]     Pt received 13 hr prep , hx of prev  reaction , patient unsure  If intravenous vs topical- stated told that if allergic to topical could be allergic to iv   Metoprolol Tartrate Shortness Of Breath   Penicillins Shortness Of Breath   Cephalexin Hives and Hypertension   Neoporacin [Bacitracin-Neomycin-Polymyxin]    Omnicef [Cefdinir]     Stabbing pain in back and upset stomach   Oxybutynin Chloride Swelling     Thank you for allowing pharmacy to be a part of this patient's care.   07/02/20, PharmD, BCPS, BCCP Clinical Pharmacist  Please check AMION for all [facility] Pharmacy phone numbers After 10:00 PM, call Main Pharmacy [phone]

## 2020-07-07 ENCOUNTER — Encounter (HOSPITAL_COMMUNITY): Payer: Self-pay | Admitting: Neurosurgery

## 2020-07-07 DIAGNOSIS — M5001 Cervical disc disorder with myelopathy,  high cervical region: Secondary | ICD-10-CM | POA: Diagnosis not present

## 2020-07-07 MED ORDER — OXYCODONE-ACETAMINOPHEN 5-325 MG PO TABS: 1.0000 | ORAL_TABLET | ORAL | Status: DC | PRN

## 2020-07-07 MED ORDER — DOCUSATE SODIUM 100 MG PO CAPS: 100.0000 mg | ORAL_CAPSULE | Freq: Two times a day (BID) | ORAL | 0 refills | Status: AC

## 2020-07-07 MED ORDER — OXYCODONE-ACETAMINOPHEN 5-325 MG PO TABS: 1.0000 | ORAL_TABLET | ORAL | 0 refills | Status: DC | PRN

## 2020-07-07 NOTE — Discharge Summary (Signed)
Physician Discharge Summary  Patient ID: Tabitha Wade MRN: [id number] DOB/AGE: 07/25/1940 78 y.o. 80 y.o.  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)   Discharged Condition: good  Hospital Course: I performed a C3-4 anterior cervical discectomy, fusion and plating on the patient on 07/06/2020.  The surgery went well.  The patient's postoperative course was unremarkable.  On postoperative day #1 she requested discharge to home.  The patient, and her daughter, were given written and oral discharge instructions.  All her questions were answered.  I instructed her to not take oral morphine anymore.  Consults: PT, OT, care management Significant Diagnostic Studies: None Treatments: C3-4 anterior cervicectomy, fusion and plating. Discharge Exam: Blood pressure (!) 160/73, pulse 100, temperature 97.9 F (36.6 C), resp. rate 18, height 5\' 2"  (1.575 m), weight 48.5 kg, SpO2 97 %. The patient is alert and pleasant.  She looks well.  Her strength is normal.  Her dressing has a small old bloodstain.  There is no hematoma or shift.  Disposition: Home  Discharge Instructions     Call MD for:  difficulty breathing, headache or visual disturbances   Complete by: As directed    Call MD for:  extreme fatigue   Complete by: As directed    Call MD for:  hives   Complete by: As directed    Call MD for:  persistant dizziness or light-headedness   Complete by: As directed    Call MD for:  persistant nausea and vomiting   Complete by: As directed    Call MD for:  redness, tenderness, or signs of infection (pain, swelling, redness, odor or green/yellow discharge around incision site)   Complete by: As directed    Call MD for:  severe uncontrolled pain   Complete by:  As directed    Call MD for:  temperature >100.4   Complete by: As directed    Diet - low sodium heart healthy   Complete by: As directed    Discharge instructions   Complete by: As directed    Call [phone] for a followup appointment. Take a stool softener while you are using pain medications.   Driving Restrictions   Complete by: As directed    Do not drive for 2 weeks.   Increase activity slowly   Complete by: As directed    Lifting restrictions   Complete by: As directed    Do not lift more than 5 pounds. No excessive bending or twisting.   May shower / Bathe   Complete by: As directed    Remove the dressing for 3 days after surgery.  You may shower, but leave the incision alone.   Remove dressing in 48 hours   Complete by: As directed       Allergies as of 07/07/2020       Reactions   Contrast Media [iodinated Diagnostic Agents]    Pt received 13 hr prep , hx of prev  reaction , patient unsure  If intravenous vs topical- stated told that if allergic to topical could be allergic to iv   Metoprolol Tartrate Shortness Of Breath   Penicillins Shortness Of Breath   Cephalexin Hives, Hypertension   Neoporacin [bacitracin-neomycin-polymyxin]    Omnicef [cefdinir]    Stabbing pain in back and upset stomach   Oxybutynin Chloride Swelling  Medication List     STOP taking these medications    meloxicam 7.5 MG tablet Commonly known as: MOBIC   morphine 15 MG tablet Commonly known as: MSIR   traMADol 50 MG tablet Commonly known as: ULTRAM   Turmeric 500 MG Caps       TAKE these medications    amLODipine 5 MG tablet Commonly known as: NORVASC Take 1 tablet (5 mg total) by mouth daily.   CALCIUM PO Take 500 mg by mouth 3 (three) times daily.   docusate sodium 100 MG capsule Commonly known as: COLACE Take 1 capsule (100 mg total) by mouth 2 (two) times daily.   famotidine 20 MG tablet Commonly known as: PEPCID Take 20 mg by mouth daily as needed  for heartburn or indigestion.   gabapentin 300 MG capsule Commonly known as: NEURONTIN Take 300 mg by mouth 3 (three) times daily.   glucosamine-chondroitin 500-400 MG tablet Take 1 tablet by mouth daily.   levothyroxine 75 MCG tablet Commonly known as: SYNTHROID Take 75 mcg by mouth daily before breakfast.   MULTI-VITAMIN PO Take 1 tablet by mouth daily.   omeprazole 40 MG capsule Commonly known as: PRILOSEC Take 40 mg by mouth daily before breakfast.   ondansetron 4 MG tablet Commonly known as: ZOFRAN Take 4 mg by mouth every 8 (eight) hours as needed for nausea or vomiting.   oxybutynin 5 MG 24 hr tablet Commonly known as: DITROPAN-XL Take 5 mg by mouth at bedtime.   oxyCODONE-acetaminophen 5-325 MG tablet Commonly known as: PERCOCET/ROXICET Take 1-2 tablets by mouth every 4 (four) hours as needed for moderate pain.   rosuvastatin 20 MG tablet Commonly known as: CRESTOR Take 20 mg by mouth at bedtime.   sulfaSALAzine 500 MG tablet Commonly known as: AZULFIDINE Take 500 mg by mouth at bedtime.   valsartan 160 MG tablet Commonly known as: DIOVAN Take 1 tablet by mouth once daily   vitamin C 1000 MG tablet Take 1,000 mg by mouth daily.         Signed: Cristi Loron 07/07/2020, 8:09 AM

## 2020-07-07 NOTE — Evaluation (Signed)
Occupational Therapy Evaluation Patient Details Name: Tabitha Wade MRN: [id number] DOB: 1940/09/07 Today's Date: 07/07/2020    History of Present Illness 80 yo F admitted for Cervical herniated disc, cervical spinal stenosis, cervicalgia, cervical myelopathy.  S/p ACDF C3-4.  PMH includes: CHF, GERD, Hypertension, Hypothyroidism.   Clinical Impression   Patient admitted for the diagnosis and procedure above.  PTA she lives with her daughter, who can assist as needed.  The daughter will be with her initially 24/7 until August, then she will return to work.  Barriers are listed below.  Currently she is needing up to [facility] for basic mobility, and Min A for lower body ADL.  Patient and family are requesting [facility] PT for lower body strengthen and balance training, which is appropriate to reduce her fall risk.      Follow Up Recommendations  Other (comment) (HH PT for balance and lower body strength)    Equipment Recommendations  Other (comment) (3MIW)    Recommendations for Other Services       Precautions / Restrictions Precautions Precautions: Cervical Precaution Booklet Issued: Yes (comment) Precaution Comments: Precautions reviewed Required Braces or Orthoses: Cervical Brace Cervical Brace: Hard collar Restrictions Weight Bearing Restrictions: No      Mobility Bed Mobility Overal bed mobility: Modified Independent               Patient Response: Cooperative  Transfers Overall transfer level: Needs assistance Equipment used: Rolling walker (2 wheeled) Transfers: Sit to/from [employer] Sit to Stand: Min guard Stand pivot transfers: Min guard            Balance Overall balance assessment: Needs assistance Sitting-balance support: No upper extremity supported;Feet supported Sitting balance-Leahy Scale: Good   Postural control: Posterior lean Standing balance support: Bilateral upper extremity supported Standing balance-Leahy Scale:  Fair Standing balance comment: able to static stand, but requires increased assist with RW.  Baseline is SPC.                           ADL either performed or assessed with clinical judgement   ADL Overall ADL's : At baseline                                       General ADL Comments: Patient able to complete dressing task at sit/stand level with supervision when standing.     Vision Baseline Vision/History: No visual deficits Patient Visual Report: No change from baseline       Perception     Praxis      Pertinent Vitals/Pain Pain Assessment: Faces Faces Pain Scale: Hurts a little bit Pain Location: cervical spine Pain Descriptors / Indicators: Operative site guarding Pain Intervention(s): Monitored during session     Hand Dominance Right   Extremity/Trunk Assessment Upper Extremity Assessment Upper Extremity Assessment: Generalized weakness   Lower Extremity Assessment Lower Extremity Assessment: Generalized weakness   Cervical / Trunk Assessment Cervical / Trunk Assessment: Kyphotic   Communication Communication Communication: No difficulties   Cognition Arousal/Alertness: Awake/alert Behavior During Therapy: WFL for tasks assessed/performed Overall Cognitive Status: Within Functional Limits for tasks assessed  Home Living Family/patient expects to be discharged to:: Private residence Living Arrangements: Children Available Help at Discharge: Family;Available 24 hours/day Type of Home: House Home Access: Stairs to enter [employer] of Steps: 2 Entrance Stairs-Rails: Can reach both;Left;Right Home Layout: One level;Able to live on main level with bedroom/bathroom     Bathroom Shower/Tub: Tub/shower unit;Walk-in shower   Bathroom Toilet: Standard Bathroom Accessibility: Yes How Accessible: Accessible via walker Home Equipment: Walker - 2  wheels;Cane - single point;Shower seat - built in          Prior Functioning/Environment Level of Independence: Independent with assistive device(s)        Comments: Uses a SPC for mobility, able to complete ADL, participates with light meal prep.        OT Problem List: Decreased strength;Decreased activity tolerance;Impaired balance (sitting and/or standing);Pain      OT Treatment/Interventions:      OT Goals(Current goals can be found in the care plan section) Acute Rehab OT Goals Patient Stated Goal: Return home with [facility] PT OT Goal Formulation: With patient Time For Goal Achievement: 07/07/20 Potential to Achieve Goals: Good  OT Frequency:     Barriers to D/C:  None noted          Co-evaluation              AM-PAC OT "6 Clicks" Daily Activity     Outcome Measure Help from another person eating meals?: None Help from another person taking care of personal grooming?: None Help from another person toileting, which includes using toliet, bedpan, or urinal?: None Help from another person bathing (including washing, rinsing, drying)?: A Little Help from another person to put on and taking off regular upper body clothing?: None Help from another person to put on and taking off regular lower body clothing?: A Little 6 Click Score: 22   End of Session Equipment Utilized During Treatment: Rolling walker;Gait belt Nurse Communication: Other (comment) (Family and patient requesting HH PT)  Activity Tolerance: Patient tolerated treatment well Patient left: in bed;with call bell/phone within reach;with family/visitor present  OT Visit Diagnosis: Unsteadiness on feet (R26.81);Muscle weakness (generalized) (M62.81);Pain Pain - Right/Left:  (c spine)                Time: 0812-0850 OT Time Calculation (min): 38 min Charges:  OT General Charges $OT Visit: 1 Visit OT Evaluation $OT Eval Moderate Complexity: 1 Mod OT Treatments $Self Care/Home Management : 23-37  mins  07/07/2020  Rich, OTR/L  Acute Rehabilitation Services  Office:  [phone]   Suzanna Obey 07/07/2020, 9:00 AM

## 2020-07-07 NOTE — Anesthesia Postprocedure Evaluation (Signed)
Anesthesia Post Note  Patient: Tabitha Wade  Procedure(s) Performed: CERVICAL THREE-FOUR ANTERIOR CERVICAL DECOMPRESSION/DISCECTOMY FUSION (Neck)     Patient location during evaluation: PACU Anesthesia Type: General Level of consciousness: awake and alert Pain management: pain level controlled Vital Signs Assessment: post-procedure vital signs reviewed and stable Respiratory status: spontaneous breathing, nonlabored ventilation and respiratory function stable Cardiovascular status: blood pressure returned to baseline and stable Postop Assessment: no apparent nausea or vomiting Anesthetic complications: no   No notable events documented.  Last Vitals:  Vitals:   07/07/20 0429 07/07/20 0720  BP: (!) 143/56 (!) 160/73  Pulse: 88 100  Resp: 18 18  Temp: 36.9 C 36.6 C  SpO2: 98% 97%    Last Pain:  Vitals:   07/07/20 1041  TempSrc:   PainSc: 6    Pain Goal: Patients Stated Pain Goal: 3 (07/07/20 1041)                 Mellody Dance

## 2020-07-07 NOTE — Progress Notes (Signed)
Patient was transported via wheelchair by volunteer for discharge home; in no acute distress nor complaints of pain nor discomfort; all belongings checked and accounted for; discharge instructions given to patient by RN and patient verbalized understanding on the instructions given.

## 2020-07-07 NOTE — TOC Initial Note (Signed)
Transition of Care [facility]) - Initial/Assessment Note    Patient Details  Name: Tabitha Wade MRN: [id number] Date of Birth: [birth year]  Transition of Care [facility]) CM/SW Contact:    Lorri Frederick, LCSW Phone Number: 07/07/2020, 10:15 AM  Clinical Narrative:     CSW contacted by RN to set up [facility].  Met with pt and daughter Tabitha Wade, permission to speak with daughter present.  Pt agreeable to [facility], choice document given, no preference indicated.  PCP in place.  Pt is vaccinated for covid.    HH set up with [facility].                Expected Discharge Plan: Home w Home Health Services Barriers to Discharge: No Barriers Identified   Patient Goals and CMS Choice Patient states their goals for this hospitalization and ongoing recovery are:: "80-90%" CMS Medicare.gov Compare Post Acute Care list provided to:: Patient Choice offered to / list presented to : Patient  Expected Discharge Plan and Services Expected Discharge Plan: Home w Home Health Services In-house Referral: Clinical Social Work   Post Acute Care Choice: Home Health Living arrangements for the past 2 months: Single Family Home Expected Discharge Date: 07/07/20               DME Arranged: [name] (DME through [facility] staff)         HH Arranged: PT HH Agency: [facility] Date [facility] Agency Contacted: 07/07/20 Time HH Agency Contacted: 1010 Representative spoke with at [facility] Agency: Amy  Prior Living Arrangements/Services Living arrangements for the past 2 months: Single Family Home Lives with:: Adult Children Patient language and need for interpreter reviewed:: Yes Do you feel safe going back to the place where you live?: Yes      Need for Family Participation in Patient Care: No (Comment) Care giver support system in place?: Yes (comment) Current home services: Other (comment) (none) Criminal Activity/Legal Involvement Pertinent to Current Situation/Hospitalization: No - Comment as needed  Activities of Daily  Living Home Assistive Devices/Equipment: Cane (specify quad or straight), Walker (specify type) ADL Screening (condition at time of admission) Patient's cognitive ability adequate to safely complete daily activities?: Yes Is the patient deaf or have difficulty hearing?: No Does the patient have difficulty seeing, even when wearing glasses/contacts?: No Does the patient have difficulty concentrating, remembering, or making decisions?: No Patient able to express need for assistance with ADLs?: Yes Does the patient have difficulty dressing or bathing?: No Independently performs ADLs?: Yes (appropriate for developmental age) Does the patient have difficulty walking or climbing stairs?: Yes Weakness of Legs: Both Weakness of Arms/Hands: Both  Permission Sought/Granted Permission sought to share information with : Family Supports Permission granted to share information with : Yes, Verbal Permission Granted  Share Information with NAME: daughter Tabitha Wade  Permission granted to share info w AGENCY: HH        Emotional Assessment Appearance:: Appears stated age Attitude/Demeanor/Rapport: Engaged Affect (typically observed): Appropriate, Pleasant Orientation: : Oriented to Self, Oriented to Place, Oriented to  Time, Oriented to Situation Alcohol / Substance Use: Not Applicable Psych Involvement: No (comment)  Admission diagnosis:  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  [address] [place] ([place])  [address] Phone: [phone] Fax: [phone]     Social Determinants of Health (SDOH) Interventions    Readmission Risk Interventions No flowsheet data found.

## 2020-07-13 ENCOUNTER — Ambulatory Visit: Payer: Medicare Other | Admitting: Cardiology

## 2020-12-23 ENCOUNTER — Ambulatory Visit (INDEPENDENT_AMBULATORY_CARE_PROVIDER_SITE_OTHER): Payer: Medicare Other | Admitting: Cardiology

## 2020-12-23 ENCOUNTER — Encounter: Payer: Self-pay | Admitting: Cardiology

## 2020-12-23 ENCOUNTER — Other Ambulatory Visit: Payer: Self-pay

## 2020-12-23 VITALS — BP 122/62 | HR 85 | Ht 62.0 in | Wt 107.2 lb

## 2020-12-23 DIAGNOSIS — R079 Chest pain, unspecified: Secondary | ICD-10-CM | POA: Diagnosis not present

## 2020-12-23 DIAGNOSIS — I5032 Chronic diastolic (congestive) heart failure: Secondary | ICD-10-CM

## 2020-12-23 DIAGNOSIS — R42 Dizziness and giddiness: Secondary | ICD-10-CM | POA: Diagnosis not present

## 2020-12-23 DIAGNOSIS — E785 Hyperlipidemia, unspecified: Secondary | ICD-10-CM

## 2020-12-23 DIAGNOSIS — I6523 Occlusion and stenosis of bilateral carotid arteries: Secondary | ICD-10-CM | POA: Diagnosis not present

## 2020-12-23 DIAGNOSIS — I10 Essential (primary) hypertension: Secondary | ICD-10-CM

## 2020-12-23 NOTE — Progress Notes (Addendum)
Cardiology Office Note:    Date:  12/23/2020   ID:  TYIANA Wade, DOB July 03, 1940, MRN [id number]  PCP:  Johny Blamer, MD  Cardiologist:  None  Electrophysiologist:  None   Referring MD: Johny Blamer, MD   Chief Complaint  Patient presents with   Chest Pain     History of Present Illness:    Tabitha Wade is a 80 y.o. female with a hx of chronic diastolic heart failure, hypertension, hyperlipidemia, hypothyroidism, GERD who presents for follow-up.  She was referred by Dr. Tiburcio Pea for an evaluation of heart failure, initially seen on 05/28/2019.  She had a nuclear stress test in 04/2010 for atypical chest pain, which showed small anterior apical defect likely breast attenuation.  She had carotid Dopplers in 2012 that showed 60 to 79% left ICA stenosis.  TTE in 09/2015 showed LVEF 65 to 70%, grade 1 diastolic dysfunction PASP 45.    Echocardiogram on 06/24/2019 showed normal biventricular function, no significant valvular disease.  Right carotid 1 to 39% stenosis, left carotid 40 to 59% stenosis on duplex on 06/15/2019.  Carotid duplex on 06/16/2020 showed 1 to 39% right carotid stenosis, 40 to 59% left carotid stenosis.  Since last clinic visit, she underwent C3-4 anterior cervical discectomy, fusion and plating on 07/06/2020.  No complications.  Hand numbness improved.  Reports burning chest pain, occurs daily.   Reports she feels chest pain most of the time but is worse with exertion.  Also feels short of breath during episodes.  She denies any lightheadedness, syncope, lower extremity edema, or palpitations.   Past Medical History:  Diagnosis Date   CHF (congestive heart failure) (HCC)    GERD (gastroesophageal reflux disease)    Hypertension    Hypothyroidism     Past Surgical History:  Procedure Laterality Date   ANTERIOR CERVICAL DECOMP/DISCECTOMY FUSION [name] 07/06/2020   Procedure: CERVICAL THREE-FOUR ANTERIOR CERVICAL DECOMPRESSION/DISCECTOMY FUSION;  Surgeon:  Tressie Stalker, MD;  Location: [facility] OR;  Service: Neurosurgery;  Laterality: [name];   BACK SURGERY     3 back surgery, 2004, 2006   INTRACAPSULAR CATARACT EXTRACTION     TUBAL LIGATION      Current Medications: Current Meds  Medication Sig   amLODipine (NORVASC) 5 MG tablet Take 1 tablet (5 mg total) by mouth daily.   Ascorbic Acid (VITAMIN C) 1000 MG tablet Take 1,000 mg by mouth daily.   CALCIUM PO Take 500 mg by mouth 3 (three) times daily.   docusate sodium (COLACE) 100 MG capsule Take 1 capsule (100 mg total) by mouth 2 (two) times daily.   famotidine (PEPCID) 20 MG tablet Take 20 mg by mouth daily as needed for heartburn or indigestion.   gabapentin (NEURONTIN) 300 MG capsule Take 300 mg by mouth 3 (three) times daily.   glucosamine-chondroitin 500-400 MG tablet Take 1 tablet by mouth daily.   levothyroxine (SYNTHROID) 75 MCG tablet Take 75 mcg by mouth daily before breakfast.   Multiple Vitamin (MULTI-VITAMIN PO) Take 1 tablet by mouth daily.   omeprazole (PRILOSEC) 40 MG capsule Take 40 mg by mouth daily before breakfast.   ondansetron (ZOFRAN) 4 MG tablet Take 4 mg by mouth every 8 (eight) hours as needed for nausea or vomiting.   oxybutynin (DITROPAN-XL) 5 MG 24 hr tablet Take 5 mg by mouth at bedtime.   oxyCODONE-acetaminophen (PERCOCET/ROXICET) 5-325 MG tablet Take 1-2 tablets by mouth every 4 (four) hours as needed for moderate pain.   rosuvastatin (CRESTOR) 20 MG tablet  Take 20 mg by mouth at bedtime.   sulfaSALAzine (AZULFIDINE) 500 MG tablet Take 500 mg by mouth at bedtime.   valsartan (DIOVAN) 160 MG tablet Take 1 tablet by mouth once daily     Allergies:   Contrast media [iodinated diagnostic agents], Metoprolol tartrate, Penicillins, Cephalexin, Neoporacin [bacitracin-neomycin-polymyxin], Omnicef [cefdinir], and Oxybutynin chloride   Social History   Socioeconomic History   Marital status: Widowed    Spouse name: Not on file   Number of children: Not on file    Years of education: Not on file   Highest education level: Not on file  Occupational History   Not on file  Tobacco Use   Smoking status: Former    Types: Cigarettes    Quit date: 08/29/1958    Years since quitting: 62.3   Smokeless tobacco: Former  Building services engineer Use: Never used  Substance and Sexual Activity   Alcohol use: No   Drug use: No   Sexual activity: Not on file  Other Topics Concern   Not on file  Social History Narrative   Not on file   Social Determinants of Health   Financial Resource Strain: Not on file  Food Insecurity: Not on file  Transportation Needs: Not on file  Physical Activity: Not on file  Stress: Not on file  Social Connections: Not on file     Family History: The patient's family history includes Heart attack in her father and mother; Heart disease in her sister and sister; Stroke in her sister.  ROS:   Please see the history of present illness.    (+) Burning chest pain (+) Bone spur (+) Bladder incompetence (+) Shortness of breath (+) Right knee pain (+) Bilateral LE edema All other systems reviewed and are negative.  EKGs/Labs/Other Studies Reviewed:    The following studies were reviewed today:  US Carotid Duplex 06/16/2020: Right Carotid: Velocities in the right ICA are consistent with a 1-39%  stenosis. Non-hemodynamically significant plaque <50% noted in the  CCA.   Left Carotid: Velocities in the left ICA are consistent with a 40-59%  stenosis. Non-hemodynamically significant plaque <50% noted in the  CCA.   Vertebrals:  Bilateral vertebral arteries demonstrate antegrade flow.  Subclavians: Normal flow hemodynamics were seen in bilateral subclavian arteries.   Echo 06/24/2019: 1. Left ventricular ejection fraction, by estimation, is 65%. The left  ventricle has normal function. The left ventricle has no regional wall  motion abnormalities. Left ventricular diastolic parameters were normal.   2. Right ventricular  systolic function is normal. The right ventricular  size is mildly enlarged. There is normal pulmonary artery systolic  pressure. The estimated right ventricular systolic pressure is 31.9 mmHg.   3. The mitral valve is normal in structure. Trivial mitral valve  regurgitation. No evidence of mitral stenosis.   4. The aortic valve is tricuspid. Aortic valve regurgitation is not  visualized. No aortic stenosis is present.   5. The inferior vena cava is normal in size with greater than 50%  respiratory variability, suggesting right atrial pressure of 3 mmHg.   EKG:   12/23/20: Normal sinus rhythm, rate 85, Q waves in V1/2 06/24/2020: sinus rhythm, rate 83 bpm, Q waves in V1/2 05/28/2019: sinus rhythm, Q waves in V1/2, rate 72  Recent Labs: 06/24/2020: ALT 21; BNP 52.1; BUN 14; Creatinine, Ser 0.75; Potassium 4.9; Sodium 140 07/01/2020: Hemoglobin 12.8; Platelets 178  Recent Lipid Panel    Component Value Date/Time   CHOL 164  06/24/2020 0911   TRIG 35 06/24/2020 0911   HDL 80 06/24/2020 0911   CHOLHDL 2.1 06/24/2020 0911   LDLCALC 76 06/24/2020 0911    Physical Exam:    VS:  BP 122/62    Pulse 85    Ht 5\' 2"  (1.575 m)    Wt 107 lb 3.2 oz (48.6 kg)    SpO2 92%    BMI 19.61 kg/m     Wt Readings from Last 3 Encounters:  12/23/20 107 lb 3.2 oz (48.6 kg)  07/06/20 107 lb (48.5 kg)  07/01/20 106 lb 8 oz (48.3 kg)     GEN:  in no acute distress HEENT: Normal NECK: No JVD; +bilateral carotid bruits CARDIAC:RRR, no murmurs, rubs, gallops RESPIRATORY:  Clear to auscultation without rales, wheezing or rhonchi  ABDOMEN: Soft, non-tender, non-distended MUSCULOSKELETAL:  +bilateral LE trace edema SKIN: Warm and dry NEUROLOGIC:  Alert and oriented x 3 PSYCHIATRIC:  Normal affect   ASSESSMENT:    1. Chest pain of uncertain etiology   2. Bilateral carotid artery stenosis   3. Lightheadedness   4. Chronic diastolic heart failure (HCC)   5. Hypertension, unspecified type   6.  Hyperlipidemia, unspecified hyperlipidemia type      PLAN:     Chest pain: Atypical in description but does worsen with exertion.  Has CAD risk factors (age, hypertension, hyperlipidemia) and known carotid atherosclerosis.  She has a contrast dye allergy, will not do coronary CTA.  Will plan Lexiscan Myoview to evaluate for ischemia.  Carotid stenosis: Right carotid 1 to 39% stenosis, left carotid 40 to 59% stenosis on carotid duplex on 06/15/2019.  Carotid duplex on 06/16/2020 showed 1 to 39% right carotid stenosis, 40 to 59% left carotid stenosis.   -Will follow with repeat duplex in 1 year (06/2021) -Aspirin, statin  Lightheadedness: No significant stenosis on carotid duplex as above.  Echocardiogram on 06/24/2019 showed normal biventricular function, no significant valvular disease.  Reports symptoms have improved.  Chronic diastolic heart failure: Has been off Lasix.  Appears euvolemic  Hypertension: On valsartan 160 mg daily and amlodipine 5 mg daily.  Hyperlipidemia: On rosuvastatin 20 mg daily.  LDL 76 06/2020.  Will recheck lipid panel  RTC in 6 months   Shared Decision Making/Informed Consent The risks [chest pain, shortness of breath, cardiac arrhythmias, dizziness, blood pressure fluctuations, myocardial infarction, stroke/transient ischemic attack, nausea, vomiting, allergic reaction, radiation exposure, metallic taste sensation and life-threatening complications (estimated to be 1 in 10,000)], benefits (risk stratification, diagnosing coronary artery disease, treatment guidance) and alternatives of a nuclear stress test were discussed in detail with Ms. Wareing and she agrees to proceed.    Medication Adjustments/Labs and Tests Ordered: Current medicines are reviewed at length with the patient today.  Concerns regarding medicines are outlined above.  Orders Placed This Encounter  Procedures   Basic metabolic panel   CBC   Lipid panel   MYOCARDIAL PERFUSION IMAGING   EKG  12-Lead    No orders of the defined types were placed in this encounter.    Patient Instructions  Medication Instructions:  No Changes In Medications at this time.  *If you need a refill on your cardiac medications before your next appointment, please call your pharmacy*  Lab Work: BMET, CBC, LIPID PANEL  If you have labs (blood work) drawn today and your tests are completely normal, you will receive your results only by: MyChart Message (if you have MyChart) OR A paper copy in the mail If you  have any lab test that is abnormal or we need to change your treatment, we will call you to review the results.  Testing/Procedures: Your physician has requested that you have a lexiscan myoview. For further information please visit [email or web address] Please follow instruction sheet, as given. This will take place at [address]  Follow-Up: At [facility], you and your health needs are our priority.  As part of our continuing mission to provide you with exceptional heart care, we have created designated Provider Care Teams.  These Care Teams include your primary Cardiologist (physician) and Advanced Practice Providers (APPs -  Physician Assistants and Nurse Practitioners) who all work together to provide you with the care you need, when you need it.  We recommend signing up for the patient portal called "MyChart".  Sign up information is provided on this After Visit Summary.  MyChart is used to connect with patients for Virtual Visits (Telemedicine).  Patients are able to view lab/test results, encounter notes, upcoming appointments, etc.  Non-urgent messages can be sent to your provider as well.   To learn more about what you can do with MyChart, go to [email or web address].    Your next appointment:   6 month(s)  The format for your next appointment:   In Person  Provider: Dr. Nathanial Rancher Stumpf,acting as a scribe for Little Ishikawa, MD.,have  documented all relevant documentation on the behalf of Little Ishikawa, MD,as directed by  Little Ishikawa, MD while in the presence of Little Ishikawa, MD.  I, Little Ishikawa, MD, have reviewed all documentation for this visit. The documentation on 12/23/20 for the exam, diagnosis, procedures, and orders are all accurate and complete.   Signed, Little Ishikawa, MD  12/23/2020 9:18 AM    [facility]

## 2020-12-23 NOTE — Addendum Note (Signed)
Addended by: Bea Laura B on: 12/23/2020 09:18 AM   Modules accepted: Orders

## 2020-12-23 NOTE — Patient Instructions (Signed)
Medication Instructions:  No Changes In Medications at this time.  *If you need a refill on your cardiac medications before your next appointment, please call your pharmacy*  Lab Work: BMET, CBC, LIPID PANEL  If you have labs (blood work) drawn today and your tests are completely normal, you will receive your results only by: MyChart Message (if you have MyChart) OR A paper copy in the mail If you have any lab test that is abnormal or we need to change your treatment, we will call you to review the results.  Testing/Procedures: Your physician has requested that you have a lexiscan myoview. For further information please visit [email or web address] Please follow instruction sheet, as given. This will take place at [address]  Follow-Up: At [facility], you and your health needs are our priority.  As part of our continuing mission to provide you with exceptional heart care, we have created designated Provider Care Teams.  These Care Teams include your primary Cardiologist (physician) and Advanced Practice Providers (APPs -  Physician Assistants and Nurse Practitioners) who all work together to provide you with the care you need, when you need it.  We recommend signing up for the patient portal called "MyChart".  Sign up information is provided on this After Visit Summary.  MyChart is used to connect with patients for Virtual Visits (Telemedicine).  Patients are able to view lab/test results, encounter notes, upcoming appointments, etc.  Non-urgent messages can be sent to your provider as well.   To learn more about what you can do with MyChart, go to [email or web address].    Your next appointment:   6 month(s)  The format for your next appointment:   In Person  Provider: Dr. Bjorn Pippin

## 2020-12-23 NOTE — Addendum Note (Signed)
Addended by: Epifanio Lesches on: 12/23/2020 09:51 AM   Modules accepted: Orders

## 2020-12-24 LAB — CBC
Hematocrit: 34.9 % (ref 34.0–46.6)
Hemoglobin: 11.6 g/dL (ref 11.1–15.9)
MCH: 33.2 pg — ABNORMAL HIGH (ref 26.6–33.0)
MCHC: 33.2 g/dL (ref 31.5–35.7)
MCV: 100 fL — ABNORMAL HIGH (ref 79–97)
Platelets: 82 10*3/uL — CL (ref 150–450)
RBC: 3.49 x10E6/uL — ABNORMAL LOW (ref 3.77–5.28)
RDW: 11.2 % — ABNORMAL LOW (ref 11.7–15.4)
WBC: 6.2 10*3/uL (ref 3.4–10.8)

## 2020-12-24 LAB — BASIC METABOLIC PANEL
BUN/Creatinine Ratio: 19 (ref 12–28)
BUN: 15 mg/dL (ref 8–27)
CO2: 27 mmol/L (ref 20–29)
Calcium: 9.2 mg/dL (ref 8.7–10.3)
Chloride: 95 mmol/L — ABNORMAL LOW (ref 96–106)
Creatinine, Ser: 0.78 mg/dL (ref 0.57–1.00)
Glucose: 88 mg/dL (ref 70–99)
Potassium: 4.7 mmol/L (ref 3.5–5.2)
Sodium: 133 mmol/L — ABNORMAL LOW (ref 134–144)
eGFR: 78 mL/min/{1.73_m2} (ref >59)

## 2020-12-24 LAB — LIPID PANEL
Chol/HDL Ratio: 2.1 ratio (ref 0.0–4.4)
Cholesterol, Total: 174 mg/dL (ref 100–199)
HDL: 83 mg/dL (ref >39)
LDL Chol Calc (NIH): 85 mg/dL (ref 0–99)
Triglycerides: 20 mg/dL (ref 0–149)
VLDL Cholesterol Cal: 6 mg/dL (ref 5–40)

## 2020-12-30 ENCOUNTER — Telehealth: Payer: Self-pay | Admitting: Cardiology

## 2020-12-30 DIAGNOSIS — D696 Thrombocytopenia, unspecified: Secondary | ICD-10-CM

## 2020-12-30 NOTE — Telephone Encounter (Signed)
Spoke with pt regarding lab results and Dr. Olam Idler recommendations. Pt is comfortable moving forward with the plan to place a referral to hematology. Referral order placed. All questions answered and pt verbalizes understanding.

## 2020-12-30 NOTE — Telephone Encounter (Signed)
Patient was returning call for results. Please advise °

## 2021-01-04 ENCOUNTER — Telehealth: Payer: Self-pay | Admitting: Hematology and Oncology

## 2021-01-04 NOTE — Telephone Encounter (Signed)
Scheduled appt per 12/30 referral. Pt's daughter is aware of appt date and time. She is aware to have pt here 15 mins prior to appt time for check in.

## 2021-01-09 ENCOUNTER — Telehealth (HOSPITAL_COMMUNITY): Payer: Self-pay | Admitting: *Deleted

## 2021-01-09 NOTE — Telephone Encounter (Signed)
Patient given detailed instructions per Myocardial Perfusion Study Information Sheet for the test on 01/13/2021 at 10:30. Patient notified to arrive 15 minutes early and that it is imperative to arrive on time for appointment to keep from having the test rescheduled.  If you need to cancel or reschedule your appointment, please call the office within 24 hours of your appointment. . Patient verbalized understanding.Tabitha Wade

## 2021-01-12 ENCOUNTER — Encounter (HOSPITAL_COMMUNITY): Payer: Medicare Other

## 2021-01-13 ENCOUNTER — Ambulatory Visit (HOSPITAL_COMMUNITY): Payer: Medicare Other | Attending: Cardiovascular Disease

## 2021-01-13 ENCOUNTER — Other Ambulatory Visit: Payer: Self-pay

## 2021-01-13 DIAGNOSIS — R079 Chest pain, unspecified: Secondary | ICD-10-CM | POA: Diagnosis present

## 2021-01-13 LAB — MYOCARDIAL PERFUSION IMAGING
LV dias vol: 61 mL (ref 46–106)
LV sys vol: 24 mL
Nuc Stress EF: 61 %
Peak HR: 98 {beats}/min
Rest HR: 75 {beats}/min
Rest Nuclear Isotope Dose: 10.4 mCi
SDS: 1
SRS: 2
SSS: 3
ST Depression (mm): 0 mm
Stress Nuclear Isotope Dose: 29.6 mCi
TID: 1.04

## 2021-01-13 MED ORDER — TECHNETIUM TC 99M TETROFOSMIN IV KIT
10.4000 | PACK | Freq: Once | INTRAVENOUS | Status: AC | PRN
  Administered 2021-01-13: 10.4 via INTRAVENOUS
  Filled 2021-01-13: qty 11

## 2021-01-13 MED ORDER — REGADENOSON 0.4 MG/5ML IV SOLN
0.4000 mg | Freq: Once | INTRAVENOUS | Status: AC
  Administered 2021-01-13: 0.4 mg via INTRAVENOUS

## 2021-01-13 MED ORDER — TECHNETIUM TC 99M TETROFOSMIN IV KIT
29.6000 | PACK | Freq: Once | INTRAVENOUS | Status: AC | PRN
  Administered 2021-01-13: 29.6 via INTRAVENOUS
  Filled 2021-01-13: qty 30

## 2021-01-15 ENCOUNTER — Other Ambulatory Visit: Payer: Self-pay | Admitting: Cardiology

## 2021-01-17 ENCOUNTER — Telehealth: Payer: Self-pay | Admitting: Cardiology

## 2021-01-17 NOTE — Telephone Encounter (Signed)
Spoke with patient and gave her the results of her stress test. She had no questions/concerns at this time.

## 2021-01-17 NOTE — Telephone Encounter (Signed)
Pt returning call for results... please advise  

## 2021-01-18 NOTE — Progress Notes (Signed)
[facility] CONSULT NOTE  Patient Care Team: Johny Blamer, MD as PCP - General (Family Medicine)  CHIEF COMPLAINTS/PURPOSE OF CONSULTATION:  Newly diagnosed thrombocytopenia  HISTORY OF PRESENTING ILLNESS:  Tabitha Wade 81 y.o. female is here because of recent diagnosis of thrombocytopenia. Labs on 12/23/2020 showed platelets 82. She presents to the clinic today for initial evaluation and discussion of treatment options.  It appears that the patient had low platelets even in [place] but there was a question mark whether or not she has platelet clumping.  Recent blood work end of December showed a platelet count of 82 and therefore she was referred to [place] for further evaluation.  She has a history of rheumatoid arthritis.  I reviewed her records extensively and collaborated the history with the patient.  MEDICAL HISTORY:  Past Medical History:  Diagnosis Date   CHF (congestive heart failure) (HCC)    GERD (gastroesophageal reflux disease)    Hypertension    Hypothyroidism     SURGICAL HISTORY: Past Surgical History:  Procedure Laterality Date   ANTERIOR CERVICAL DECOMP/DISCECTOMY FUSION [name] 07/06/2020   Procedure: CERVICAL THREE-FOUR ANTERIOR CERVICAL DECOMPRESSION/DISCECTOMY FUSION;  Surgeon: Tressie Stalker, MD;  Location: [facility] OR;  Service: Neurosurgery;  Laterality: [name];   BACK SURGERY     3 back surgery, 2004, 2006   INTRACAPSULAR CATARACT EXTRACTION     TUBAL LIGATION      SOCIAL HISTORY: Social History   Socioeconomic History   Marital status: Widowed    Spouse name: Not on file   Number of children: Not on file   Years of education: Not on file   Highest education level: Not on file  Occupational History   Not on file  Tobacco Use   Smoking status: Former    Types: Cigarettes    Quit date: 08/29/1958    Years since quitting: 62.4   Smokeless tobacco: Former  Building services engineer Use: Never used  Substance and Sexual Activity   Alcohol use:  No   Drug use: No   Sexual activity: Not on file  Other Topics Concern   Not on file  Social History Narrative   Not on file   Social Determinants of Health   Financial Resource Strain: Not on file  Food Insecurity: Not on file  Transportation Needs: Not on file  Physical Activity: Not on file  Stress: Not on file  Social Connections: Not on file  Intimate Partner Violence: Not on file    FAMILY HISTORY: Family History  Problem Relation Age of Onset   Heart attack Mother    Heart attack Father    Heart disease Sister        cabg   Heart disease Sister        cabg   Stroke Sister        cabg    ALLERGIES:  is allergic to contrast media [iodinated contrast media], metoprolol tartrate, penicillins, cephalexin, neoporacin [bacitracin-neomycin-polymyxin], omnicef [cefdinir], and oxybutynin chloride.  MEDICATIONS:  Current Outpatient Medications  Medication Sig Dispense Refill   amLODipine (NORVASC) 5 MG tablet Take 1 tablet (5 mg total) by mouth daily. 90 tablet 3   Ascorbic Acid (VITAMIN C) 1000 MG tablet Take 1,000 mg by mouth daily.     CALCIUM PO Take 500 mg by mouth 3 (three) times daily.     docusate sodium (COLACE) 100 MG capsule Take 1 capsule (100 mg total) by mouth 2 (two) times daily. 60 capsule 0  famotidine (PEPCID) 20 MG tablet Take 20 mg by mouth daily as needed for heartburn or indigestion.     gabapentin (NEURONTIN) 300 MG capsule Take 300 mg by mouth 3 (three) times daily.     glucosamine-chondroitin 500-400 MG tablet Take 1 tablet by mouth daily.     levothyroxine (SYNTHROID) 75 MCG tablet Take 75 mcg by mouth daily before breakfast.     Multiple Vitamin (MULTI-VITAMIN PO) Take 1 tablet by mouth daily.     omeprazole (PRILOSEC) 40 MG capsule Take 40 mg by mouth daily before breakfast.     ondansetron (ZOFRAN) 4 MG tablet Take 4 mg by mouth every 8 (eight) hours as needed for nausea or vomiting.     oxybutynin (DITROPAN-XL) 5 MG 24 hr tablet Take 5 mg by  mouth at bedtime.     oxyCODONE-acetaminophen (PERCOCET/ROXICET) 5-325 MG tablet Take 1-2 tablets by mouth every 4 (four) hours as needed for moderate pain. 30 tablet 0   rosuvastatin (CRESTOR) 20 MG tablet Take 20 mg by mouth at bedtime.     sulfaSALAzine (AZULFIDINE) 500 MG tablet Take 500 mg by mouth at bedtime.     valsartan (DIOVAN) 160 MG tablet TAKE 1 TABLET BY MOUTH EVERY DAY 90 tablet 1   No current facility-administered medications for this visit.    REVIEW OF SYSTEMS:   Constitutional: Denies fevers, chills or abnormal night sweats Eyes: Denies blurriness of vision, double vision or watery eyes Ears, nose, mouth, throat, and face: Denies mucositis or sore throat Respiratory: Denies cough, dyspnea or wheezes Cardiovascular: Denies palpitation, chest discomfort or lower extremity swelling Gastrointestinal:  Denies nausea, heartburn or change in bowel habits Skin: Easy bruising Lymphatics: Denies new lymphadenopathy or easy bruising Neurological:Denies numbness, tingling or new weaknesses Behavioral/Psych: Mood is stable, no new changes  All other systems were reviewed with the patient and are negative.  PHYSICAL EXAMINATION: ECOG PERFORMANCE STATUS: 1 - Symptomatic but completely ambulatory  Vitals:   01/19/21 1553  BP: (!) 157/59  Pulse: 83  Resp: 18  Temp: 97.7 F (36.5 C)  SpO2: 96%   Filed Weights   01/19/21 1553  Weight: 110 lb 8 oz (50.1 kg)       LABORATORY DATA:  I have reviewed the data as listed Lab Results  Component Value Date   WBC 8.1 01/19/2021   HGB 11.9 (L) 01/19/2021   HCT 35.9 (L) 01/19/2021   MCV 102.9 (H) 01/19/2021   PLT 204 01/19/2021   Lab Results  Component Value Date   NA 133 (L) 12/23/2020   K 4.7 12/23/2020   CL 95 (L) 12/23/2020   CO2 27 12/23/2020    RADIOGRAPHIC STUDIES: I have personally reviewed the radiological reports and agreed with the findings in the report.  ASSESSMENT AND PLAN:  Thrombocytopenia (HCC) Lab  review: 06/24/2020: Platelets 79 07/01/2020: Platelets 178 12/23/2020: Platelets 82  Mild thrombocytopenia: Platelet count 128 on 12/23/2015; 129 on 01/21/2016, 144 in February 2009 Remainder of the CBC with differential was normal, hemoglobin 15.9, WBC 4.8 with normal differential  Differential diagnosis: 1. Low-grade ITP 2. medication induced: On further review patient is not taking any medications that are associated with thrombocytopenia 3. Bone marrow factors 4. Hepatitis B/C 5. Splenomegaly: No enlarged spleen was palpable to physical exam. 6.  Spurious causes  ------------------------------------------ Repeat blood work with careful monitoring for clumping revealed the platelet count is actually 204 Therefore the cause of thrombocytopenia is curious related to clumping  No further work-up is necessary  and she will be seen by [place] on an as-needed basis   All questions were answered. The patient knows to call the clinic with any problems, questions or concerns.   Sabas Sous, MD, MPH 01/19/2021    I, Alda Ponder, am acting as scribe for Serena Croissant, MD.  I have reviewed the above documentation for accuracy and completeness, and I agree with the above.

## 2021-01-19 ENCOUNTER — Inpatient Hospital Stay: Payer: Medicare Other

## 2021-01-19 ENCOUNTER — Other Ambulatory Visit: Payer: Self-pay

## 2021-01-19 ENCOUNTER — Other Ambulatory Visit: Payer: Self-pay | Admitting: *Deleted

## 2021-01-19 ENCOUNTER — Inpatient Hospital Stay: Payer: Medicare Other | Attending: Hematology and Oncology | Admitting: Hematology and Oncology

## 2021-01-19 DIAGNOSIS — D696 Thrombocytopenia, unspecified: Secondary | ICD-10-CM

## 2021-01-19 DIAGNOSIS — M069 Rheumatoid arthritis, unspecified: Secondary | ICD-10-CM

## 2021-01-19 LAB — CBC WITH DIFFERENTIAL (CANCER CENTER ONLY)
Abs Immature Granulocytes: 0.03 10*3/uL (ref 0.00–0.07)
Basophils Absolute: 0.1 10*3/uL (ref 0.0–0.1)
Basophils Relative: 1 %
Eosinophils Absolute: 0 10*3/uL (ref 0.0–0.5)
Eosinophils Relative: 0 %
HCT: 35.9 % — ABNORMAL LOW (ref 36.0–46.0)
Hemoglobin: 11.9 g/dL — ABNORMAL LOW (ref 12.0–15.0)
Immature Granulocytes: 0 %
Lymphocytes Relative: 24 %
Lymphs Abs: 2 10*3/uL (ref 0.7–4.0)
MCH: 34.1 pg — ABNORMAL HIGH (ref 26.0–34.0)
MCHC: 33.1 g/dL (ref 30.0–36.0)
MCV: 102.9 fL — ABNORMAL HIGH (ref 80.0–100.0)
Monocytes Absolute: 1 10*3/uL (ref 0.1–1.0)
Monocytes Relative: 13 %
Neutro Abs: 5 10*3/uL (ref 1.7–7.7)
Neutrophils Relative %: 62 %
Platelet Count: 204 10*3/uL (ref 150–400)
RBC: 3.49 MIL/uL — ABNORMAL LOW (ref 3.87–5.11)
RDW: 12 % (ref 11.5–15.5)
WBC Count: 8.1 10*3/uL (ref 4.0–10.5)
nRBC: 0 % (ref 0.0–0.2)

## 2021-01-19 NOTE — Assessment & Plan Note (Signed)
Lab review: 06/24/2020: Platelets 79 07/01/2020: Platelets 178 12/23/2020: Platelets 82  Mild thrombocytopenia: Platelet count 128 on 12/23/2015; 129 on 01/21/2016, 144 in February 2009 Remainder of the CBC with differential was normal, hemoglobin 15.9, WBC 4.8 with normal differential  Differential diagnosis: 1. Low-grade ITP 2. medication induced: On further review patient is not taking any medications that are associated with thrombocytopenia 3. Bone marrow factors 4. Hepatitis B/C 5. Splenomegaly: No enlarged spleen was palpable to physical exam. 6.  Spurious causes  I discussed with the patient that the level of thrombocytopenia is very mild and that these levels, there are usually no adverse effects. There is usually no risk of bleeding and hence it can be observed without making any changes to patient's medications or requiring any further investigations like bone marrow biopsies.  I recommended watchful monitoring.

## 2021-02-24 ENCOUNTER — Telehealth: Payer: Self-pay | Admitting: Cardiology

## 2021-02-24 MED ORDER — AMLODIPINE BESYLATE 5 MG PO TABS: 5.0000 mg | ORAL_TABLET | Freq: Every day | ORAL | 3 refills | Status: DC

## 2021-02-24 NOTE — Telephone Encounter (Signed)
°*  STAT* If patient is at the pharmacy, call can be transferred to refill team.   1. Which medications need to be refilled? (please list name of each medication and dose if known) amLODipine (NORVASC) 2.5 MG tablet  2. Which pharmacy/location (including street and city if local pharmacy) is medication to be sent to? [id number] - [place], [place]. Phone:  [phone]  Fax:  [phone]      3. Do they need a 30 day or 90 day supply? 90 ds

## 2021-02-24 NOTE — Telephone Encounter (Signed)
Spoke to patient to let her know a refill for Amlodipine (Norvasc) 2.5 MG [facility] to [facility].

## 2021-05-30 ENCOUNTER — Telehealth: Payer: Self-pay | Admitting: Cardiology

## 2021-05-30 DIAGNOSIS — I6523 Occlusion and stenosis of bilateral carotid arteries: Secondary | ICD-10-CM

## 2021-05-30 NOTE — Telephone Encounter (Signed)
Patient had to r/s her carotid doppler because she will be out of town. She is r/s for June 21st but her order expires on June 16th, can someone update that order and extend the dates so I can link it to her appt?

## 2021-06-04 ENCOUNTER — Other Ambulatory Visit: Payer: Self-pay | Admitting: Cardiology

## 2021-06-16 ENCOUNTER — Encounter (HOSPITAL_COMMUNITY): Payer: Medicare Other

## 2021-06-21 ENCOUNTER — Ambulatory Visit (HOSPITAL_COMMUNITY)
Admission: RE | Admit: 2021-06-21 | Discharge: 2021-06-21 | Disposition: A | Discharge status: Home / Self Care | Payer: Medicare Other | Source: Ambulatory Visit | Attending: Cardiovascular Disease | Admitting: Cardiovascular Disease

## 2021-06-21 DIAGNOSIS — I6523 Occlusion and stenosis of bilateral carotid arteries: Secondary | ICD-10-CM

## 2021-06-28 ENCOUNTER — Encounter: Payer: Self-pay | Admitting: Cardiology

## 2021-06-28 ENCOUNTER — Ambulatory Visit (INDEPENDENT_AMBULATORY_CARE_PROVIDER_SITE_OTHER): Payer: Medicare Other | Admitting: Cardiology

## 2021-06-28 VITALS — BP 140/62 | HR 65 | Ht 62.0 in | Wt 106.0 lb

## 2021-06-28 DIAGNOSIS — I6529 Occlusion and stenosis of unspecified carotid artery: Secondary | ICD-10-CM

## 2021-06-28 DIAGNOSIS — R079 Chest pain, unspecified: Secondary | ICD-10-CM | POA: Diagnosis not present

## 2021-06-28 DIAGNOSIS — I5032 Chronic diastolic (congestive) heart failure: Secondary | ICD-10-CM | POA: Diagnosis not present

## 2021-06-28 DIAGNOSIS — E785 Hyperlipidemia, unspecified: Secondary | ICD-10-CM | POA: Diagnosis not present

## 2021-06-28 DIAGNOSIS — I10 Essential (primary) hypertension: Secondary | ICD-10-CM

## 2021-06-28 MED ORDER — EZETIMIBE 10 MG PO TABS: 10.0000 mg | ORAL_TABLET | Freq: Every day | ORAL | 3 refills | Status: DC

## 2021-06-28 NOTE — Patient Instructions (Signed)
Medication Instructions:  START Zetia 10 mg daily  *If you need a refill on your cardiac medications before your next appointment, please call your pharmacy*   Lab Work: Please return for FASTING labs in 2 months (Lipid)  Our in office lab hours are Monday-Friday 8:00-4:00, closed for lunch 12:45-1:45 pm.  No appointment needed.  [facility] locations:   [facility] - [address]  - [address] ([facility]) - [address] [phone] N. [address]   [place] - [address]    [place]  - [address]    [place] - [address] - [address] [employer]  - [address] - [address] Chief Technology Officer)  If you have labs (blood work) drawn today and your tests are completely normal, you will receive your results only by: [employer] (if you have MyChart) OR A paper copy in the mail If you have any lab test that is abnormal or we need to change your treatment, we will call you to review the results.  Follow-Up: At [facility], you and your health needs are our priority.  As part of our continuing mission to provide you with exceptional heart care, we have created designated Provider Care Teams.  These Care Teams include your primary Cardiologist (physician) and Advanced Practice Providers (APPs -  Physician Assistants and Nurse Practitioners) who all work together to provide you with the care you need, when you need it.  We recommend signing up for the patient portal called "MyChart".  Sign up information is provided on this After Visit Summary.  MyChart is used to connect with patients for Virtual Visits (Telemedicine).  Patients are able to view lab/test results, encounter notes, upcoming appointments, etc.  Non-urgent messages can be sent to your provider as well.   To learn more about what you can do with MyChart, go to  [email or web address].    Your next appointment:   12 month(s)  The format for your next appointment:   In Person  Provider:   Dr. Bjorn Pippin  Other Instructions Please check your blood pressure at home twice daily, write it down.  Call the office or send message via Mychart with the readings in 1 week for Dr. Bjorn Pippin to review.    Important Information About Sugar

## 2021-06-28 NOTE — Progress Notes (Signed)
Cardiology Office Note:    Date:  06/28/2021   ID:  Tabitha Wade, DOB [birth year], MRN [id number]  PCP:  Johny Blamer, MD  Cardiologist:  None  Electrophysiologist:  None   Referring MD: Johny Blamer, MD   Chief Complaint  Patient presents with   Chest Pain    History of Present Illness:    Tabitha Wade is a 81 y.o. female with a hx of chronic diastolic heart failure, hypertension, hyperlipidemia, hypothyroidism, GERD who presents for follow-up.  She was referred by Dr. Tiburcio Pea for an evaluation of heart failure, initially seen on 05/28/2019.  She had a nuclear stress test in 04/2010 for atypical chest pain, which showed small anterior apical defect likely breast attenuation.  She had carotid Dopplers in 2012 that showed 60 to 79% left ICA stenosis.  TTE in 09/2015 showed LVEF 65 to 70%, grade 1 diastolic dysfunction PASP 45.    Echocardiogram on 06/24/2019 showed normal biventricular function, no significant valvular disease.  Right carotid 1 to 39% stenosis, left carotid 40 to 59% stenosis on duplex on 06/15/2019.  Carotid duplex on 06/16/2020 showed 1 to 39% right carotid stenosis, 40 to 59% left carotid stenosis.  Lexiscan Myoview on 01/13/2021 showed normal perfusion, EF 61%.  Since last clinic visit, she reports that she is doing ok.  Continues to report intermittent burning in chest, worse with palpation.  Denies any dyspnea, lightheadedness, syncope, or palpitations.  Reports occasional swelling in legs.  Has not been checking BP at home.  Past Medical History:  Diagnosis Date   CHF (congestive heart failure) (HCC)    GERD (gastroesophageal reflux disease)    Hypertension    Hypothyroidism     Past Surgical History:  Procedure Laterality Date   ANTERIOR CERVICAL DECOMP/DISCECTOMY FUSION [name] 07/06/2020   Procedure: CERVICAL THREE-FOUR ANTERIOR CERVICAL DECOMPRESSION/DISCECTOMY FUSION;  Surgeon: Tressie Stalker, MD;  Location: [facility] OR;  Service: Neurosurgery;   Laterality: [name];   BACK SURGERY     3 back surgery, 2004, 2006   INTRACAPSULAR CATARACT EXTRACTION     TUBAL LIGATION      Current Medications: Current Meds  Medication Sig   amLODipine (NORVASC) 5 MG tablet Take 1 tablet (5 mg total) by mouth daily.   Ascorbic Acid (VITAMIN C) 1000 MG tablet Take 1,000 mg by mouth daily.   CALCIUM PO Take 500 mg by mouth 3 (three) times daily.   docusate sodium (COLACE) 100 MG capsule Take 1 capsule (100 mg total) by mouth 2 (two) times daily.   ezetimibe (ZETIA) 10 MG tablet Take 1 tablet (10 mg total) by mouth daily.   gabapentin (NEURONTIN) 300 MG capsule Take 300 mg by mouth 3 (three) times daily.   glucosamine-chondroitin 500-400 MG tablet Take 1 tablet by mouth daily.   levothyroxine (SYNTHROID) 75 MCG tablet Take 75 mcg by mouth daily before breakfast.   Multiple Vitamin (MULTI-VITAMIN PO) Take 1 tablet by mouth daily.   omeprazole (PRILOSEC) 40 MG capsule Take 40 mg by mouth daily before breakfast.   ondansetron (ZOFRAN) 4 MG tablet Take 4 mg by mouth every 8 (eight) hours as needed for nausea or vomiting.   oxybutynin (DITROPAN-XL) 5 MG 24 hr tablet Take 5 mg by mouth at bedtime.   rosuvastatin (CRESTOR) 20 MG tablet Take 20 mg by mouth at bedtime.   sulfaSALAzine (AZULFIDINE) 500 MG tablet Take 500 mg by mouth at bedtime.   valsartan (DIOVAN) 160 MG tablet TAKE 1 TABLET BY MOUTH EVERY DAY   [  DISCONTINUED] oxyCODONE-acetaminophen (PERCOCET/ROXICET) 5-325 MG tablet Take 1-2 tablets by mouth every 4 (four) hours as needed for moderate pain.     Allergies:   Contrast media [iodinated contrast media], Metoprolol tartrate, Penicillins, Cephalexin, Neoporacin [bacitracin-neomycin-polymyxin], Omnicef [cefdinir], and Oxybutynin chloride   Social History   Socioeconomic History   Marital status: Widowed    Spouse name: Not on file   Number of children: Not on file   Years of education: Not on file   Highest education level: Not on file   Occupational History   Not on file  Tobacco Use   Smoking status: Former    Types: Cigarettes    Quit date: 08/29/1958    Years since quitting: 62.8   Smokeless tobacco: Former  Building services engineer Use: Never used  Substance and Sexual Activity   Alcohol use: No   Drug use: No   Sexual activity: Not on file  Other Topics Concern   Not on file  Social History Narrative   Not on file   Social Determinants of Health   Financial Resource Strain: Not on file  Food Insecurity: Not on file  Transportation Needs: Not on file  Physical Activity: Not on file  Stress: Not on file  Social Connections: Not on file     Family History: The patient's family history includes Heart attack in her father and mother; Heart disease in her sister and sister; Stroke in her sister.  ROS:   Please see the history of present illness.     All other systems reviewed and are negative.  EKGs/Labs/Other Studies Reviewed:    The following studies were reviewed today:  US Carotid Duplex 06/16/2020: Right Carotid: Velocities in the right ICA are consistent with a 1-39%  stenosis. Non-hemodynamically significant plaque <50% noted in the  CCA.   Left Carotid: Velocities in the left ICA are consistent with a 40-59%  stenosis. Non-hemodynamically significant plaque <50% noted in the  CCA.   Vertebrals:  Bilateral vertebral arteries demonstrate antegrade flow.  Subclavians: Normal flow hemodynamics were seen in bilateral subclavian arteries.   Echo 06/24/2019: 1. Left ventricular ejection fraction, by estimation, is 65%. The left  ventricle has normal function. The left ventricle has no regional wall  motion abnormalities. Left ventricular diastolic parameters were normal.   2. Right ventricular systolic function is normal. The right ventricular  size is mildly enlarged. There is normal pulmonary artery systolic  pressure. The estimated right ventricular systolic pressure is 31.9 mmHg.   3. The  mitral valve is normal in structure. Trivial mitral valve  regurgitation. No evidence of mitral stenosis.   4. The aortic valve is tricuspid. Aortic valve regurgitation is not  visualized. No aortic stenosis is present.   5. The inferior vena cava is normal in size with greater than 50%  respiratory variability, suggesting right atrial pressure of 3 mmHg.   EKG:   06/28/2021: Normal sinus rhythm, rate 65, Q waves in V1/2 12/23/20: Normal sinus rhythm, rate 85, Q waves in V1/2 06/24/2020: sinus rhythm, rate 83 bpm, Q waves in V1/2 05/28/2019: sinus rhythm, Q waves in V1/2, rate 72  Recent Labs: 12/23/2020: BUN 15; Creatinine, Ser 0.78; Potassium 4.7; Sodium 133 01/19/2021: Hemoglobin 11.9; Platelet Count 204  Recent Lipid Panel    Component Value Date/Time   CHOL 174 12/23/2020 0930   TRIG 20 12/23/2020 0930   HDL 83 12/23/2020 0930   CHOLHDL 2.1 12/23/2020 0930   LDLCALC 85 12/23/2020 0930  Physical Exam:    VS:  BP 140/62   Pulse 65   Ht  (1.575 m)   Wt 106 lb (48.1 kg)   SpO2 (!) 86%   BMI 19.39 kg/m     Wt Readings from Last 3 Encounters:  06/28/21 106 lb (48.1 kg)  01/19/21 110 lb 8 oz (50.1 kg)  12/23/20 107 lb 3.2 oz (48.6 kg)     GEN:  in no acute distress HEENT: Normal NECK: No JVD; +bilateral carotid bruits CARDIAC:RRR, no murmurs, rubs, gallops RESPIRATORY:  Clear to auscultation without rales, wheezing or rhonchi  ABDOMEN: Soft, non-tender, non-distended MUSCULOSKELETAL:  +bilateral LE trace edema SKIN: Warm and dry NEUROLOGIC:  Alert and oriented x 3 PSYCHIATRIC:  Normal affect   ASSESSMENT:    1. Chest pain of uncertain etiology   2. Stenosis of carotid artery, unspecified laterality   3. Hyperlipidemia, unspecified hyperlipidemia type   4. Chronic diastolic heart failure (HCC)   5. Hypertension, unspecified type     PLAN:    Chest pain: Atypical in description but does worsen with exertion.  Has CAD risk factors (age, hypertension,  hyperlipidemia) and known carotid atherosclerosis.  She has a contrast dye allergy, will not do coronary CTA.  Lexiscan Myoview on 01/13/2021 showed normal perfusion, EF 61%. -Suspect noncardiac chest pain, no further cardiac work-up recommended  Carotid stenosis: Right carotid 1 to 39% stenosis, left carotid 40 to 59% stenosis on carotid duplex on 06/15/2019.  Carotid duplex on 06/16/2020 showed 1 to 39% right carotid stenosis, 40 to 59% left carotid stenosis.  Carotid duplex 06/2021 showed bilateral 1 to 39% carotid stenosis. -Aspirin, statin  Lightheadedness: No significant stenosis on carotid duplex as above.  Echocardiogram on 06/24/2019 showed normal biventricular function, no significant valvular disease.  Reports symptoms have improved.  Chronic diastolic heart failure: Has been off Lasix.  Appears euvolemic  Hypertension: On valsartan 160 mg daily and amlodipine 5 mg daily.  Hyperlipidemia: On rosuvastatin 20 mg daily.  LDL 85 on 12/23/2020.  Add Zetia 10 mg daily for goal LDL less than 70  Thrombocytopenia: Platelets 82 on 12/23/2020.  Referred to hematology.  Repeat CBC showed platelet count 204, thrombocytopenia was spurious due to platelet clumping   RTC in 6 months    Medication Adjustments/Labs and Tests Ordered: Current medicines are reviewed at length with the patient today.  Concerns regarding medicines are outlined above.  Orders Placed This Encounter  Procedures   Lipid panel   EKG 12-Lead    Meds ordered this encounter  Medications   ezetimibe (ZETIA) 10 MG tablet    Sig: Take 1 tablet (10 mg total) by mouth daily.    Dispense:  90 tablet    Refill:  3     Patient Instructions  Medication Instructions:  START Zetia 10 mg daily  *If you need a refill on your cardiac medications before your next appointment, please call your pharmacy*   Lab Work: Please return for FASTING labs in 2 months (Lipid)  Our in office lab hours are Monday-Friday 8:00-4:00,  closed for lunch 12:45-1:45 pm.  No appointment needed.  [facility] locations:   [facility] - [address]  - [address] ([facility]) - [address] [phone] [address]   [place] - [address]    [employer]  - [address]    [place] - [address]  [address] - [address] [employer]  - [address] [place] - [address] Chief Technology Officer)  If you have labs (blood work) drawn today and your tests are completely normal, you will receive your results only by: [employer] (if you have MyChart) OR A paper copy in the mail If you have any lab test that is abnormal or we need to change your treatment, we will call you to review the results.  Follow-Up: At [facility], you and your health needs are our priority.  As part of our continuing mission to provide you with exceptional heart care, we have created designated Provider Care Teams.  These Care Teams include your primary Cardiologist (physician) and Advanced Practice Providers (APPs -  Physician Assistants and Nurse Practitioners) who all work together to provide you with the care you need, when you need it.  We recommend signing up for the patient portal called "MyChart".  Sign up information is provided on this After Visit Summary.  MyChart is used to connect with patients for Virtual Visits (Telemedicine).  Patients are able to view lab/test results, encounter notes, upcoming appointments, etc.  Non-urgent messages can be sent to your provider as well.   To learn more about what you can do with MyChart, go to [email or web address].    Your next appointment:   12 month(s)  The format for your next appointment:   In Person  Provider:   Dr. Bjorn Pippin  Other Instructions Please check your blood pressure at home twice daily, write it down.  Call the office or send message via Mychart with the  readings in 1 week for Dr. Bjorn Pippin to review.    Important Information About Sugar         Signed, Little Ishikawa, MD  06/28/2021 9:33 AM    [facility]

## 2021-10-12 ENCOUNTER — Telehealth: Payer: Self-pay | Admitting: Physician Assistant

## 2021-10-12 NOTE — Telephone Encounter (Signed)
Pt called because her BP has been high all day. SBP has been 190 or more at times.  She has not been feeling well for the last few days and has been in bed most of the day.  When her daughter got home, she was complaining of being a little light-headed and has felt weak. She has some mild CP that she gets occasionally, is not really worried about that, stress test was negative earlier this year.  Family she lives with has recently had Covid, her daughter tested positive last Friday.   She does not really want to go to the hospital.  2 options: Take another amlodipine 5 mg, go to the ER if sx worsen or it does not help. Call 911 and go to the ER  At this time, Ms Frankland wants to try taking the med and seeing if she feels better. She is encouraged to come to the ER if sx do not improve. Her daughter understands the options, will also encourage pt to come to the ER if she does not feel better soon.   Rosaria Ferries, PA-C 10/12/2021 8:19 PM

## 2021-10-13 NOTE — Telephone Encounter (Signed)
Patient stated that at midnight her BP was 150/59 and this morning 135/65. She denies lightheadedness, dizziness, or chest pain. She does report congestion and thinks she has a "virus." She has been exposed to [place]. She took 2 home COVID test with neg results. Recommended that if she feels like she is getting more congestion or feverish, to contact PCP or go to ED. She verbalized understanding.

## 2021-10-15 NOTE — Telephone Encounter (Signed)
Agree with plan 

## 2021-11-22 ENCOUNTER — Other Ambulatory Visit: Payer: Self-pay | Admitting: Cardiology

## 2022-01-14 ENCOUNTER — Other Ambulatory Visit: Payer: Self-pay | Admitting: Cardiology

## 2022-07-27 ENCOUNTER — Other Ambulatory Visit: Payer: Self-pay | Admitting: Cardiology

## 2022-10-28 IMAGING — MR MR CERVICAL SPINE W/O CM
4 of 5 series · 37 of 48 positions shown · non-contrast
Comparison: None.

CLINICAL DATA: Bilateral hand numbness for 2 months.

EXAM:
MRI CERVICAL SPINE WITHOUT CONTRAST
TECHNIQUE: Multiplanar, multisequence MR imaging of the cervical spine was
performed. No intravenous contrast was administered.

[Series 4: T2 · sagittal · 3.0mm · 0.41mm/px · 9 of 18 slices shown (1 of 2)]
[im 1/18]
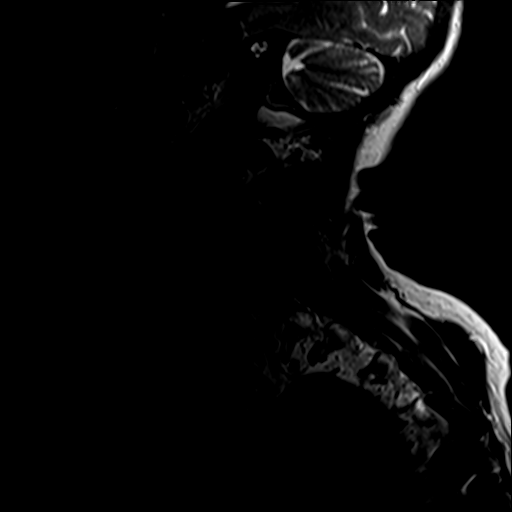
[im 3/18]
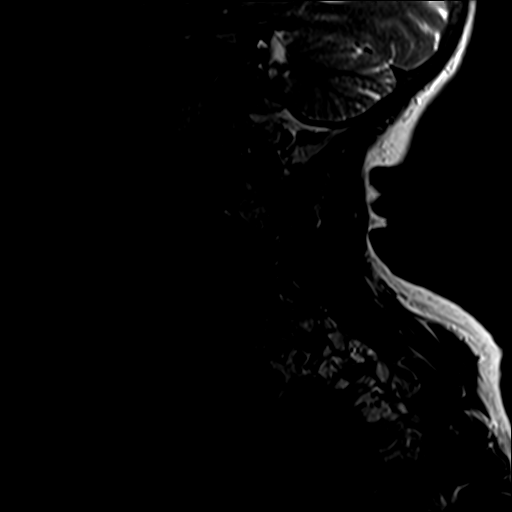
[im 5/18]
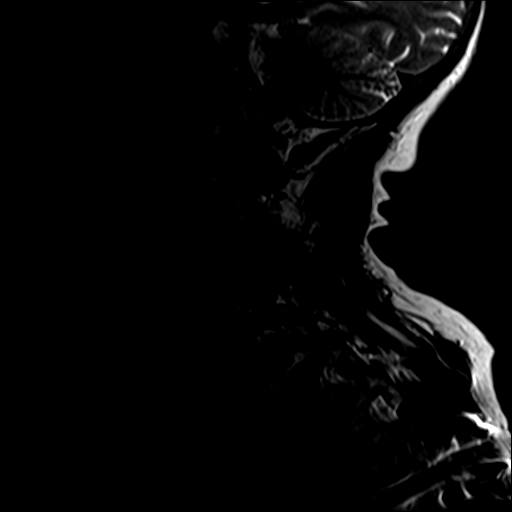
[im 7/18]
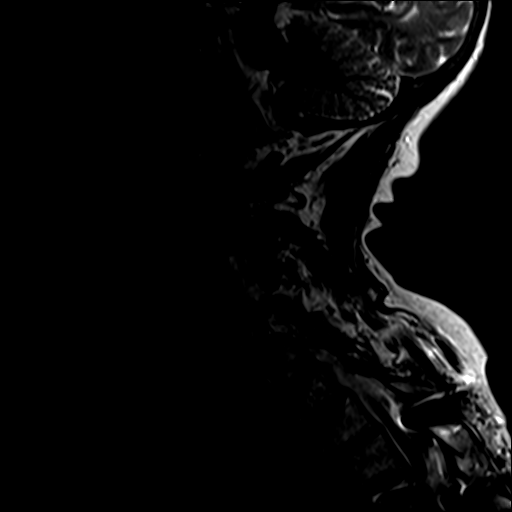
[im 9/18]
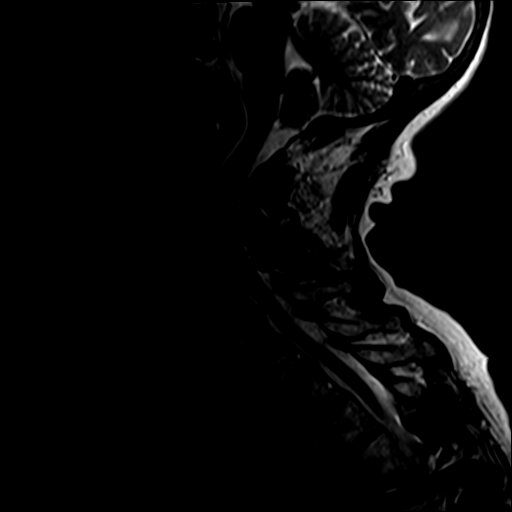
[im 11/18]
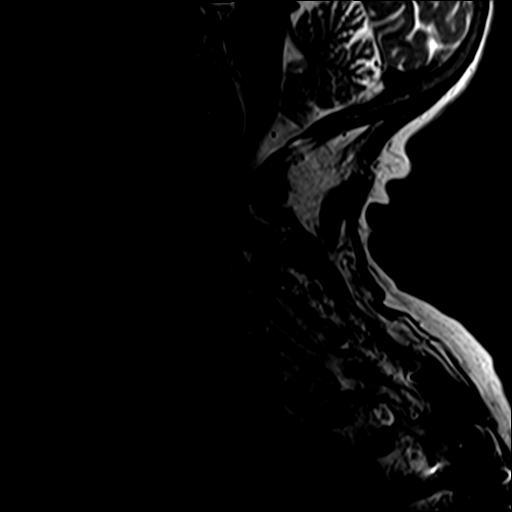
[im 13/18]
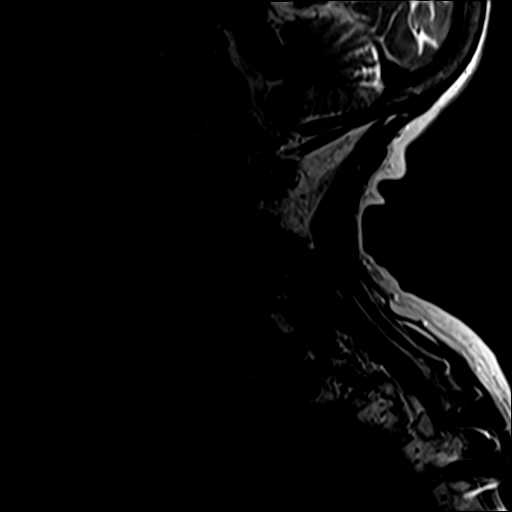
[im 15/18]
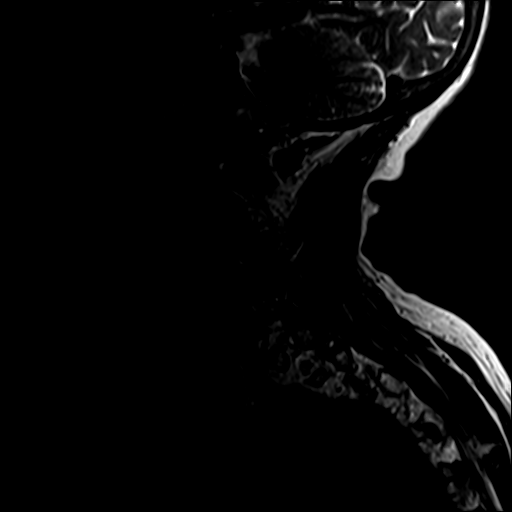
[im 18/18]
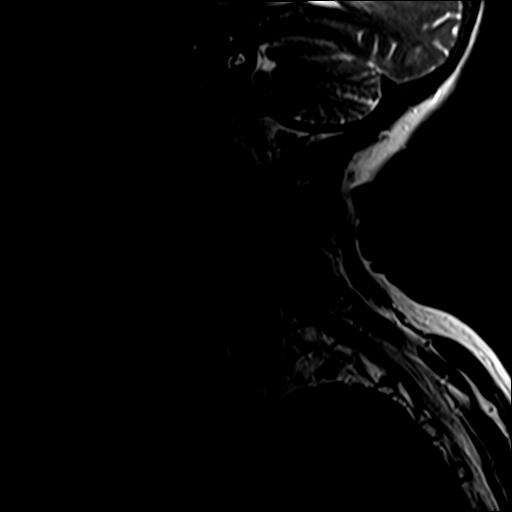

[Series 5: STIR · sagittal · 3.0mm · 0.82mm/px · 9 of 18 slices shown]
[im 1/18]
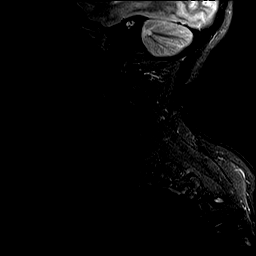
[im 3/18]
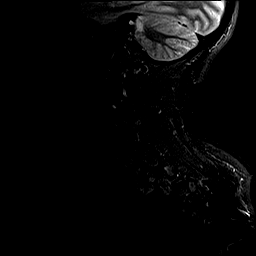
[im 5/18]
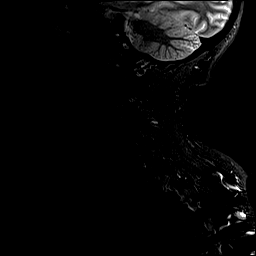
[im 7/18]
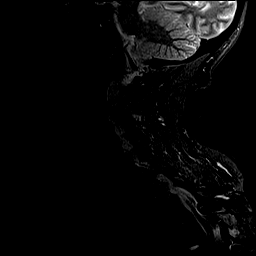
[im 9/18]
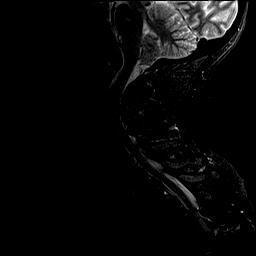
[im 11/18]
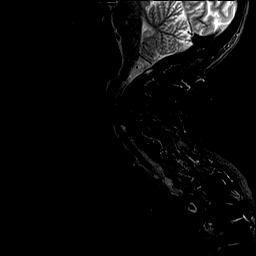
[im 13/18]
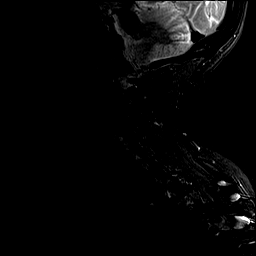
[im 15/18]
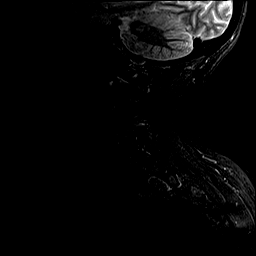
[im 18/18]
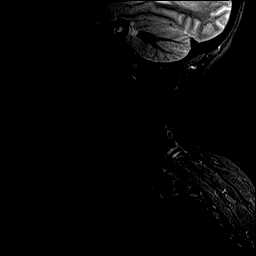

[Series 6: T1 · sagittal · 3.0mm · 0.82mm/px · 8 of 18 slices shown]
[im 1/18]
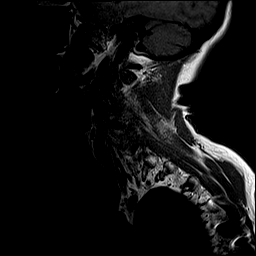
[im 3/18]
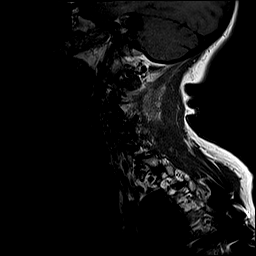
[im 5/18]
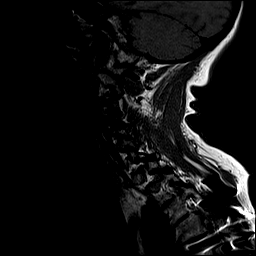
[im 8/18]
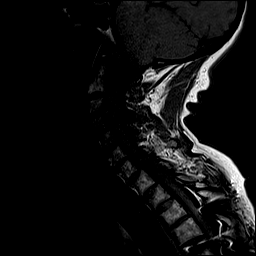
[im 10/18]
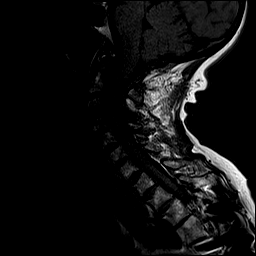
[im 13/18]
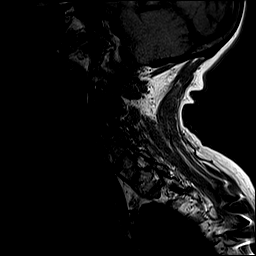
[im 15/18]
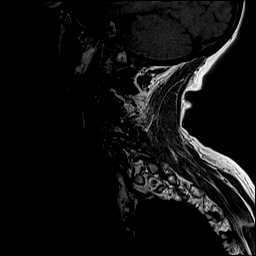
[im 18/18]
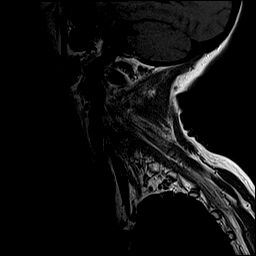

[Series 7: T2 · axial · 3.0mm · 0.70mm/px · z∈[-93,-13]mm · 11 of 24 slices shown (2 of 2)]
[im 1/24]
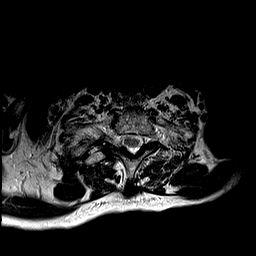
[im 3/24]
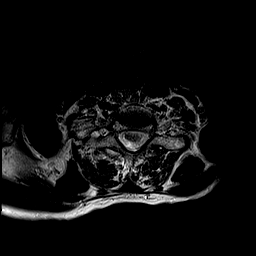
[im 5/24]
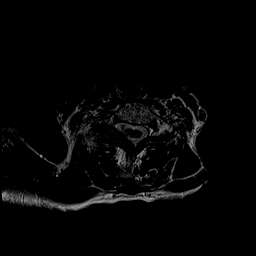
[im 7/24]
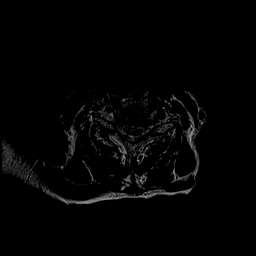
[im 10/24]
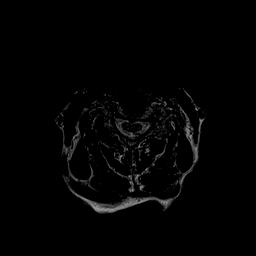
[im 12/24]
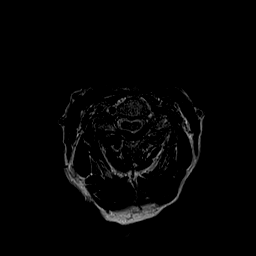
[im 14/24]
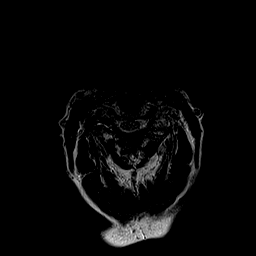
[im 17/24]
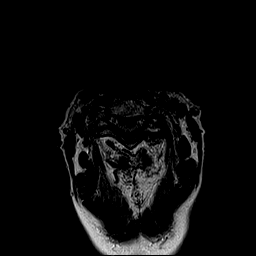
[im 19/24]
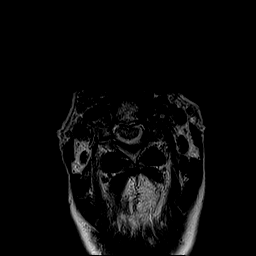
[im 21/24]
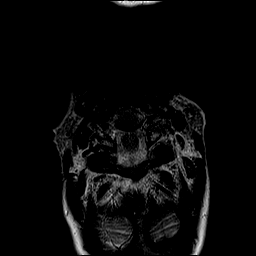
[im 24/24]
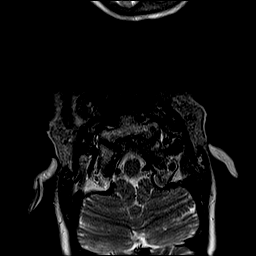

[37 of 48 positions shown; findings below may reference images not displayed]

FINDINGS: Alignment: Grade 1 anterolisthesis of C4 on C5.

Vertebrae: No cervical spine fracture or suspicious osseous lesion.
Mild chronic T1, T2, and T3 compression fractures. Partially
visualized susceptibility artifact from posterior fusion in the
thoracic spine. Mild degenerative endplate edema at C3-4 greater
than C6-7.

Cord: Faint T2 hyperintensity in the spinal cord at C3-4.

Posterior Fossa, vertebral arteries, paraspinal tissues:
Unremarkable.

Disc levels:

C2-3: Moderate left facet arthrosis without disc herniation or
stenosis.

C3-4: Disc bulging, a broad central disc protrusion, uncovertebral
spurring, infolding of the ligamentum flavum, and severe left
greater than right facet arthrosis result in severe spinal stenosis
(4-5 mm AP spinal canal diameter) with moderate to severe cord
flattening and mild-to-moderate right and moderate to severe left
neural foraminal stenosis.

C4-5: Anterolisthesis with bulging uncovered disc, uncovertebral
spurring, infolding ligamentum flavum, and severe facet arthrosis
result in mild-to-moderate spinal stenosis and moderate right and
mild left neural foraminal stenosis.

C5-6: A small central disc protrusion and infolding of the
ligamentum flavum result in mild-to-moderate spinal stenosis. There
is moderate right and severe left facet arthrosis without
significant neural foraminal stenosis.

C6-7: Disc bulging and uncovertebral spurring result in mild spinal
stenosis and mild-to-moderate left neural foraminal stenosis.

C7-T1: Minimal disc bulging, mild uncovertebral spurring, and severe
right facet arthrosis result in mild right neural foraminal stenosis
without spinal stenosis.

T1-2: Only imaged sagittally. Disc bulging and severe right and mild
left facet arthrosis without significant stenosis.
IMPRESSION: 1. Multilevel cervical disc and facet degeneration, worst at C3-4
where there is severe spinal stenosis with moderate to severe cord
flattening and mild-to-moderate right and moderate to severe left
neural foraminal stenosis. Faint T2 hyperintensity in the spinal
cord at this level may reflect spondylotic myelopathy.
2. Mild-to-moderate spinal stenosis at C4-5 and C5-6.

## 2022-11-08 ENCOUNTER — Ambulatory Visit: Payer: Medicare Other | Attending: Cardiology | Admitting: Cardiology

## 2022-11-08 ENCOUNTER — Telehealth (HOSPITAL_COMMUNITY): Payer: Self-pay | Admitting: *Deleted

## 2022-11-08 ENCOUNTER — Encounter: Payer: Self-pay | Admitting: Cardiology

## 2022-11-08 VITALS — BP 102/50 | HR 70 | Ht 62.0 in | Wt 107.0 lb

## 2022-11-08 DIAGNOSIS — R079 Chest pain, unspecified: Secondary | ICD-10-CM | POA: Insufficient documentation

## 2022-11-08 DIAGNOSIS — E785 Hyperlipidemia, unspecified: Secondary | ICD-10-CM | POA: Insufficient documentation

## 2022-11-08 DIAGNOSIS — I6523 Occlusion and stenosis of bilateral carotid arteries: Secondary | ICD-10-CM | POA: Diagnosis present

## 2022-11-08 DIAGNOSIS — I10 Essential (primary) hypertension: Secondary | ICD-10-CM | POA: Diagnosis present

## 2022-11-08 DIAGNOSIS — I5032 Chronic diastolic (congestive) heart failure: Secondary | ICD-10-CM | POA: Diagnosis present

## 2022-11-08 NOTE — Progress Notes (Signed)
Cardiology Office Note:  .   Date:  11/08/2022  ID:  Tabitha Wade, DOB 1940-11-01, MRN [id number] PCP: Noberto Retort, MD  [facility] Providers Cardiologist:  Little Ishikawa, MD  History of Present Illness: .   Tabitha Wade is a 82 y.o. female with a past medical history of chronic diastolic heart failure, hypertension, hyperlipidemia, hypothyroidism, GERD.  Patient is followed by Dr. Bjorn Pippin and presents today for an annual follow-up appointment.  Per chart review, patient previously had nuclear stress test in 2012 which showed a small anterior apical defect that was likely breast attenuation.  Had carotid Dopplers in 2012 that showed 60-79% left ICA stenosis.  Echocardiogram in 2017 showed EF 65-70%, grade 1 DD.  More recently, echocardiogram in 06/2019 showed normal biventricular function with no significant valvular disease.  Underwent carotid dopplers in 06/2019 that showed 1-39% stenosis in right ICA, 40-59% stenosis in left ICA.  Carotid Dopplers in 06/2020 with similar findings.  Patient underwent nuclear stress test in 01/2021 that was a normal, low risk study.  There is no evidence of ischemia, no evidence of infarction.  EF estimated at 61%.  Carotid dopplers in 06/2021 showed 1-39% stenosis in bilateral ICAs.  Patient was last seen by Dr. Gaynelle Arabian in 06/2021.  At that time, patient reported intermittent burning in the chest that was worse with palpation.  Denied dyspnea, lightheadedness, syncope, palpitations.  It was thought that her chest pain was noncardiac.  Of note, patient has contrast dye allergy so she has not undergone coronary CTA in the past.  Remained on aspirin, statin therapy.  Had been off Lasix but was euvolemic on exam.  Today patient reports new onset chest pain and fatigue. The pain, described as a pressure/burning sensation, is located in the chest and radiates to the left. It is associated with a sharp, stabbing pain in the center of the back.  The symptoms are exacerbated by physical activity, specifically when doing laundry, and are relieved by rest. The patient denies experiencing the pain at rest. 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  In addition to the chest pain, the patient experiences labored breathing when overly tired and occasional dizziness. She also reports episodes of swelling in the feet and ankles when on her feet for extended periods, which resolves with rest and elevation.  The patient's last stress test was almost two years ago, and she reports that her chest pain now is different than the pain she was feeling at that time.    ROS: Reports chest pain on exertion that is relieved by rest. Denies shortness of breath, palpitations, syncope, near syncope   Studies Reviewed: .   Cardiac Studies & Procedures     STRESS TESTS  MYOCARDIAL PERFUSION IMAGING 01/13/2021  Narrative   The study is normal. The study is low risk.   No ST deviation was noted.   LV perfusion is normal. There is no evidence of ischemia. There is no evidence of infarction.   Left ventricular function is normal. Nuclear stress EF: 61 %. The left ventricular ejection fraction is normal (55-65%). End diastolic cavity size is normal.   Prior study available for comparison from 04/02/2010.  Normal study without ischemia or infarction. Normal LVEF, 61%. This is a low-risk study.   ECHOCARDIOGRAM  ECHOCARDIOGRAM COMPLETE 06/24/2019  Narrative ECHOCARDIOGRAM REPORT  Patient Name:   [name] Date of Exam: 06/24/2019 Medical Rec #:  [id number]        Height:       62.0 in Accession #:    [id number]       Weight:       113.6 lb Date of Birth:  [birth year]        BSA:          1.503 m Patient Age:    19 years         BP:           180/60 mmHg Patient Gender: F                HR:           72  bpm. Exam Location:  [address]  Procedure: 2D Echo, Cardiac Doppler and Color Doppler  Indications:    R42; R42 Lightheaded  History:        Patient has prior history of Echocardiogram examinations, most recent 09/13/2015. Signs/Symptoms:Dizziness/Lightheadedness; Risk Factors:Hypertension.  Sonographer:    Samule Ohm RDCS Referring Phys: [id number] CHRISTOPHER L SCHUMANN  IMPRESSIONS   1. Left ventricular ejection fraction, by estimation, is 65%. The left ventricle has normal function. The left ventricle has no regional wall motion abnormalities. Left ventricular diastolic parameters were normal. 2. Right ventricular systolic function is normal. The right ventricular size is mildly enlarged. There is normal pulmonary artery systolic pressure. The estimated right ventricular systolic pressure is 31.9 mmHg. 3. The mitral valve is normal in structure. Trivial mitral valve regurgitation. No evidence of mitral stenosis. 4. The aortic valve is tricuspid. Aortic valve regurgitation is not visualized. No aortic stenosis is present. 5. The inferior vena cava is normal in size with greater than 50% respiratory variability, suggesting right atrial pressure of 3 mmHg.  FINDINGS Left Ventricle: Left ventricular ejection fraction, by estimation, is 65%. The left ventricle has normal function. The left ventricle has no regional wall motion abnormalities. The left ventricular internal cavity size was normal in size. There is borderline left ventricular hypertrophy. Left ventricular diastolic parameters were normal.  Right Ventricle: The right ventricular size is mildly enlarged. No increase in right ventricular wall thickness. Right ventricular systolic function is normal. There is normal pulmonary artery systolic pressure. The tricuspid regurgitant velocity is 2.69 m/s, and with an assumed right atrial pressure of 3 mmHg, the estimated right ventricular systolic pressure is 31.9 mmHg.  Left  Atrium: Left atrial size was normal in size.  Right Atrium: Right atrial size was normal in size.  Pericardium: There is no evidence of pericardial effusion.  Mitral Valve: The mitral valve is normal in structure. Normal mobility of the mitral valve leaflets. Trivial mitral valve regurgitation. No evidence of mitral valve stenosis.  Tricuspid Valve: The tricuspid valve is normal in structure. Tricuspid valve regurgitation is mild . No evidence of tricuspid stenosis.  Aortic Valve: The aortic valve is tricuspid. . There is mild thickening of the aortic valve. Aortic valve regurgitation is not visualized. No aortic stenosis is present. There is mild thickening of the aortic valve.  Pulmonic Valve: The pulmonic valve was grossly normal. Pulmonic valve regurgitation is mild. No evidence of pulmonic stenosis.  Aorta: The aortic root is normal in size and structure.  Venous: The inferior vena cava is normal in size with greater than 50% respiratory variability, suggesting right atrial pressure of 3 mmHg.  IAS/Shunts: No atrial level shunt detected by color flow Doppler.   LEFT  VENTRICLE PLAX 2D LVIDd:         4.30 cm  Diastology LVIDs:         2.60 cm  LV e' lateral:   14.50 cm/s LV PW:         1.10 cm  LV E/e' lateral: 5.4 LV IVS:        1.00 cm  LV e' medial:    7.07 cm/s LVOT diam:     1.90 cm  LV E/e' medial:  11.1 LV SV:         54 LV SV Index:   36 LVOT Area:     2.84 cm   RIGHT VENTRICLE             IVC RV S prime:     16.60 cm/s  IVC diam: 1.20 cm TAPSE (M-mode): 1.4 cm RVSP:           31.9 mmHg  LEFT ATRIUM           Index       RIGHT ATRIUM           Index LA diam:      2.90 cm 1.93 cm/m  RA Pressure: 3.00 mmHg LA Vol (A2C): 23.4 ml 15.57 ml/m RA Area:     9.58 cm LA Vol (A4C): 27.1 ml 18.03 ml/m RA Volume:   19.80 ml  13.17 ml/m AORTIC VALVE LVOT Vmax:   79.00 cm/s LVOT Vmean:  54.300 cm/s LVOT VTI:    0.191 m  AORTA Ao Root diam: 2.90 cm Ao Asc diam:  2.60  cm  MV E velocity: 78.40 cm/s  TRICUSPID VALVE MV A velocity: 87.80 cm/s  TR Peak grad:   28.9 mmHg MV E/A ratio:  0.89        TR Vmax:        269.00 cm/s Estimated RAP:  3.00 mmHg RVSP:           31.9 mmHg  SHUNTS Systemic VTI:  0.19 m Systemic Diam: 1.90 cm  Weston Brass MD Electronically signed by Weston Brass MD Signature Date/Time: 06/25/2019/6:22:33 AM    Final             Risk Assessment/Calculations:             Physical Exam:   VS:  BP (!) 102/50 (BP Location: Left Arm, Patient Position: Sitting, Cuff Size: Normal)   Pulse 70   Ht 5\' 2"  (1.575 m)   Wt 107 lb (48.5 kg)   BMI 19.57 kg/m    Wt Readings from Last 3 Encounters:  11/08/22 107 lb (48.5 kg)  06/28/21 106 lb (48.1 kg)  01/19/21 110 lb 8 oz (50.1 kg)    GEN: Well nourished, well developed in no acute distress. Sitting comfortably on the exam table in no acute distress  NECK: No JVD; No carotid bruits CARDIAC: RRR, no murmurs, rubs, gallops. Radial pulses 2+ bilaterally  RESPIRATORY:  Clear to auscultation without rales, wheezing or rhonchi. Normal work of breathing on room air  ABDOMEN: Soft, non-tender, non-distended EXTREMITIES:  No edema; No deformity   ASSESSMENT AND PLAN: .    Chest Pain  -In the past, patient has complained of chest pain.  Pain was atypical in description, described as a burning feeling across her chest.  Patient has a contrast dye allergy so has not undergone coronary CTA.  Underwent nuclear stress test in 01/2021 that showed normal perfusion, EF 61% - Today, patient reports having chest pressure on exertion  that goes away with rest. Reports that this is different then the chest pain she has had in the past. - EKG today is without ischemic changes  - As patient now complains of chest pressure that only occurs with exertion and is relieved by rest that is different to the burning chest pain she felt in 01/2021, reasonable to proceed with nuclear stress test  - Ordered  nuclear stress test - needs lexiscan, not a treadmill candidate   Carotid Stenosis  -Most recent carotid Dopplers from 06/2021 showed 1-39% stenosis in bilateral ICAs -Previously, had stenosis of 40-59% in left ICA in 2022 -Ordered carotid Dopplers for monitoring -Continue statin therapy  Chronic Diastolic Heart Failure  -Most recent echocardiogram from 06/2019 showed EF 60-65%, no regional wall motion abnormalities, normal RV function, normal PA systolic pressure, no significant valvular abnormalities -Nuclear stress test from 01/2021 showed EF 61%  - Patient is euvolemic today without use of diuretics. Denies shortness of breath, orthopnea. Occasionally gets mild ankle swelling if she stands for too long, but this improves when she elevates her feet  - Encouraged patient to elevate her feet when resting   HTN  -BP well-controlled on current regimen -Continue amlodipine 5 mg daily, valsartan 160 mg daily - Patient's PCP monitoring BMP- just had labs drawn earlier this week   HLD  - Lipid panel from 02/2022 showed LDL 71, HDL 81, triglycerides 52, total cholesterol 163  - Continue zetia 10 mg daily, crestor 20 mg daily    Informed Consent   Shared Decision Making/Informed Consent{  The risks [chest pain, shortness of breath, cardiac arrhythmias, dizziness, blood pressure fluctuations, myocardial infarction, stroke/transient ischemic attack, nausea, vomiting, allergic reaction, radiation exposure, metallic taste sensation and life-threatening complications (estimated to be 1 in 10,000)], benefits (risk stratification, diagnosing coronary artery disease, treatment guidance) and alternatives of a nuclear stress test were discussed in detail with Ms. Sigel and she agrees to proceed.     Dispo: Follow up in   Signed, Jonita Albee, PA-C

## 2022-11-08 NOTE — Telephone Encounter (Signed)
Patient given detailed instructions per Myocardial Perfusion Study Information Sheet for the test on 11/13/2022 at 12:45. Patient notified to arrive 15 minutes early and that it is imperative to arrive on time for appointment to keep from having the test rescheduled.  If you need to cancel or reschedule your appointment, please call the office within 24 hours of your appointment. . Patient verbalized understanding.Tabitha Wade

## 2022-11-08 NOTE — Patient Instructions (Signed)
Medication Instructions:  No changes *If you need a refill on your cardiac medications before your next appointment, please call your pharmacy*  Lab Work: No labs  Testing/Procedures:  Your physician has requested that you have a carotid duplex. This test is an ultrasound of the carotid arteries in your neck. It looks at blood flow through these arteries that supply the brain with blood. Allow one hour for this exam. There are no restrictions or special instructions.  Robet Leu PA-C has ordered a Lexiscan Myocardial Perfusion Imaging Study.  Please arrive 15 minutes prior to your appointment time for registration and insurance purposes.   The test will take approximately 3 to 4 hours to complete; you may bring reading material.  If someone comes with you to your appointment, they will need to remain in the main lobby due to limited space in the testing area. **If you are pregnant or breastfeeding, please notify the nuclear lab prior to your appointment**   How to prepare for your Myocardial Perfusion Test: Do not eat or drink 3 hours prior to your test, except you may have water. Do not consume products containing caffeine (regular or decaffeinated) 12 hours prior to your test. (ex: coffee, chocolate, sodas, tea). Do wear comfortable clothes (no dresses or overalls) and walking shoes, tennis shoes preferred (No heels or open toe shoes are allowed). Do NOT wear cologne, perfume, aftershave, or lotions (deodorant is allowed). If you use an inhaler, use it the AM of your test and bring it with you.  If you use a nebulizer, use it the AM of your test.  If these instructions are not followed, your test will have to be rescheduled.  Follow-Up: At [facility], you and your health needs are our priority.  As part of our continuing mission to provide you with exceptional heart care, we have created designated Provider Care Teams.  These Care Teams include your primary Cardiologist  (physician) and Advanced Practice Providers (APPs -  Physician Assistants and Nurse Practitioners) who all work together to provide you with the care you need, when you need it.  We recommend signing up for the patient portal called "MyChart".  Sign up information is provided on this After Visit Summary.  MyChart is used to connect with patients for Virtual Visits (Telemedicine).  Patients are able to view lab/test results, encounter notes, upcoming appointments, etc.  Non-urgent messages can be sent to your provider as well.   To learn more about what you can do with MyChart, go to [email or web address].    Your next appointment:   3 month(s)  Provider:   Any APP

## 2022-11-13 ENCOUNTER — Ambulatory Visit (HOSPITAL_COMMUNITY): Payer: Medicare Other | Attending: Cardiology

## 2022-11-13 DIAGNOSIS — R079 Chest pain, unspecified: Secondary | ICD-10-CM | POA: Insufficient documentation

## 2022-11-13 LAB — MYOCARDIAL PERFUSION IMAGING
LV dias vol: 69 mL (ref 46–106)
LV sys vol: 22 mL
Nuc Stress EF: 68 %
Peak HR: 117 {beats}/min
Rest HR: 66 {beats}/min
Rest Nuclear Isotope Dose: 10.1 mCi
SDS: 1
SRS: 2
SSS: 3
ST Depression (mm): 0 mm
Stress Nuclear Isotope Dose: 30.7 mCi
TID: 1.06

## 2022-11-13 MED ORDER — TECHNETIUM TC 99M TETROFOSMIN IV KIT
30.7000 | PACK | Freq: Once | INTRAVENOUS | Status: AC | PRN
  Administered 2022-11-13: 30.7 via INTRAVENOUS

## 2022-11-13 MED ORDER — AMINOPHYLLINE 25 MG/ML IV SOLN
75.0000 mg | Freq: Once | INTRAVENOUS | Status: AC
  Administered 2022-11-13: 75 mg via INTRAVENOUS

## 2022-11-13 MED ORDER — REGADENOSON 0.4 MG/5ML IV SOLN
0.4000 mg | Freq: Once | INTRAVENOUS | Status: AC
  Administered 2022-11-13: 0.4 mg via INTRAVENOUS

## 2022-11-13 MED ORDER — TECHNETIUM TC 99M TETROFOSMIN IV KIT
10.1000 | PACK | Freq: Once | INTRAVENOUS | Status: AC | PRN
  Administered 2022-11-13: 10.1 via INTRAVENOUS

## 2022-11-14 ENCOUNTER — Telehealth: Payer: Self-pay

## 2022-11-14 NOTE — Telephone Encounter (Signed)
Called patient advised of below they verbalized understanding.

## 2022-11-14 NOTE — Telephone Encounter (Signed)
-----   Message from Jonita Albee sent at 11/14/2022 12:18 PM EST ----- Please tell patient that her nuclear stress test was a normal, low risk study. LV has normal function and EF is normal at 68%.   This is great news!  Thanks,  [employer]

## 2022-11-26 ENCOUNTER — Ambulatory Visit (HOSPITAL_COMMUNITY)
Admission: RE | Admit: 2022-11-26 | Discharge: 2022-11-26 | Disposition: A | Discharge status: Home / Self Care | Payer: Medicare Other | Source: Ambulatory Visit | Attending: Cardiology | Admitting: Cardiology

## 2022-11-26 DIAGNOSIS — I6523 Occlusion and stenosis of bilateral carotid arteries: Secondary | ICD-10-CM | POA: Diagnosis present

## 2022-12-02 ENCOUNTER — Other Ambulatory Visit: Payer: Self-pay | Admitting: Cardiology

## 2022-12-03 ENCOUNTER — Telehealth: Payer: Self-pay

## 2022-12-03 DIAGNOSIS — I6523 Occlusion and stenosis of bilateral carotid arteries: Secondary | ICD-10-CM

## 2022-12-03 NOTE — Telephone Encounter (Signed)
Called patient advised of below they verbalized understanding Patient needs to reschedule appointment in February with Marjie Skiff APP

## 2022-12-03 NOTE — Telephone Encounter (Signed)
-----   Message from Jonita Albee sent at 11/29/2022  7:07 PM EST ----- Please tell patient that the ultrasounds of her carotid arteries showed 1-39% stenosis in both carotid arteries. This means that there is only mild disease in the blood vessels. Result is unchanged compared to ultrasounds from 06/2021. Overall, good news!  Recommend repeat scans in 1  year  Thanks KJ

## 2022-12-11 ENCOUNTER — Telehealth: Payer: Self-pay | Admitting: Student

## 2022-12-11 NOTE — Telephone Encounter (Signed)
-----   Message from [facility] sent at 12/03/2022  5:00 PM EST ----- Regarding: Reschedule Patient would like to reschedule appointment with Marjie Skiff PA-C in February. Unable to see schedules properly if someone could please help her that'd be great.

## 2022-12-11 NOTE — Telephone Encounter (Signed)
Patient states that she is in [place] and will be there for a while, she will call back to schedule when she comes back to [place].

## 2022-12-18 NOTE — Addendum Note (Signed)
Addended by: Clotilde Dieter on: 12/18/2022 03:07 PM   Modules accepted: Orders

## 2022-12-18 NOTE — Telephone Encounter (Signed)
Forgot to order the ultrasound for this patient ordered and ready to be scheduled

## 2023-01-09 IMAGING — CR DG CERVICAL SPINE 2 OR 3 VIEWS
2 series · 2 of 2 positions shown · non-contrast
Comparison: Cervical spine MRI 04/24/2020

CLINICAL DATA: C3-4 ACDF.

EXAM:
CERVICAL SPINE - 2-3 VIEW

[xtable (1 of 2)]
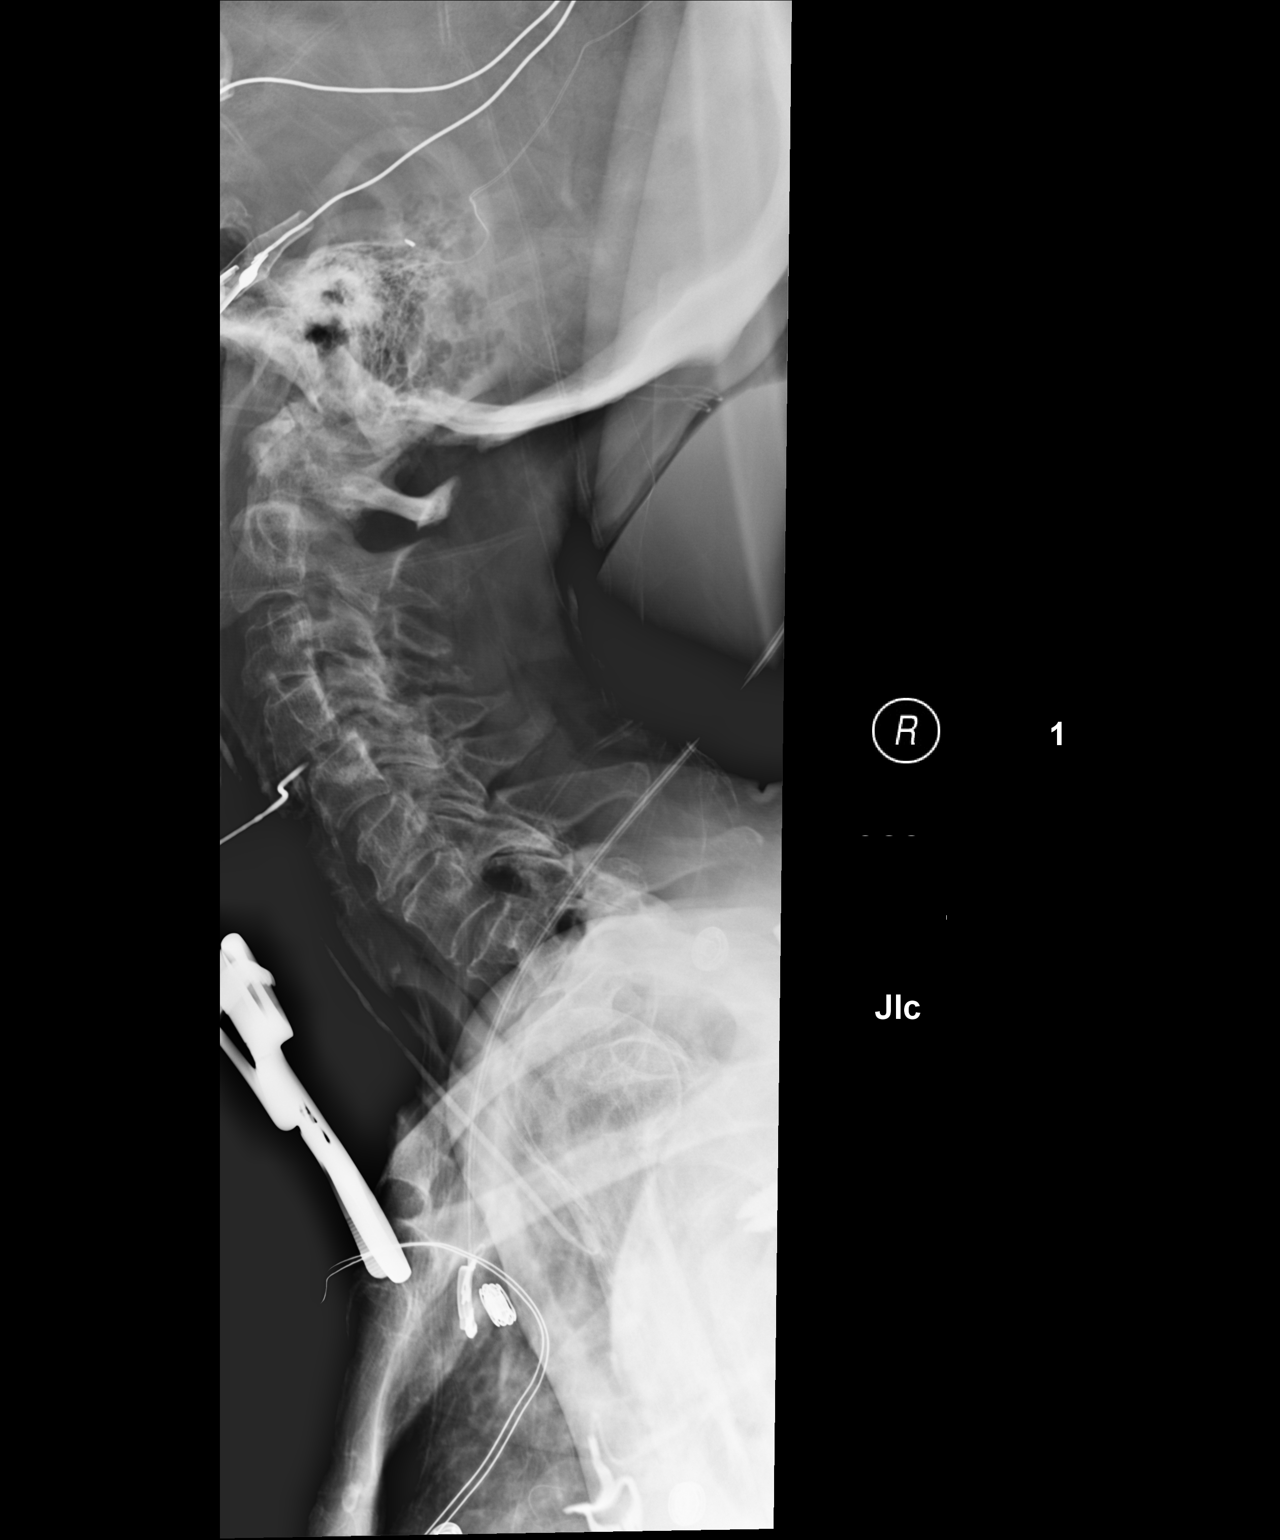

[xtable (2 of 2)]
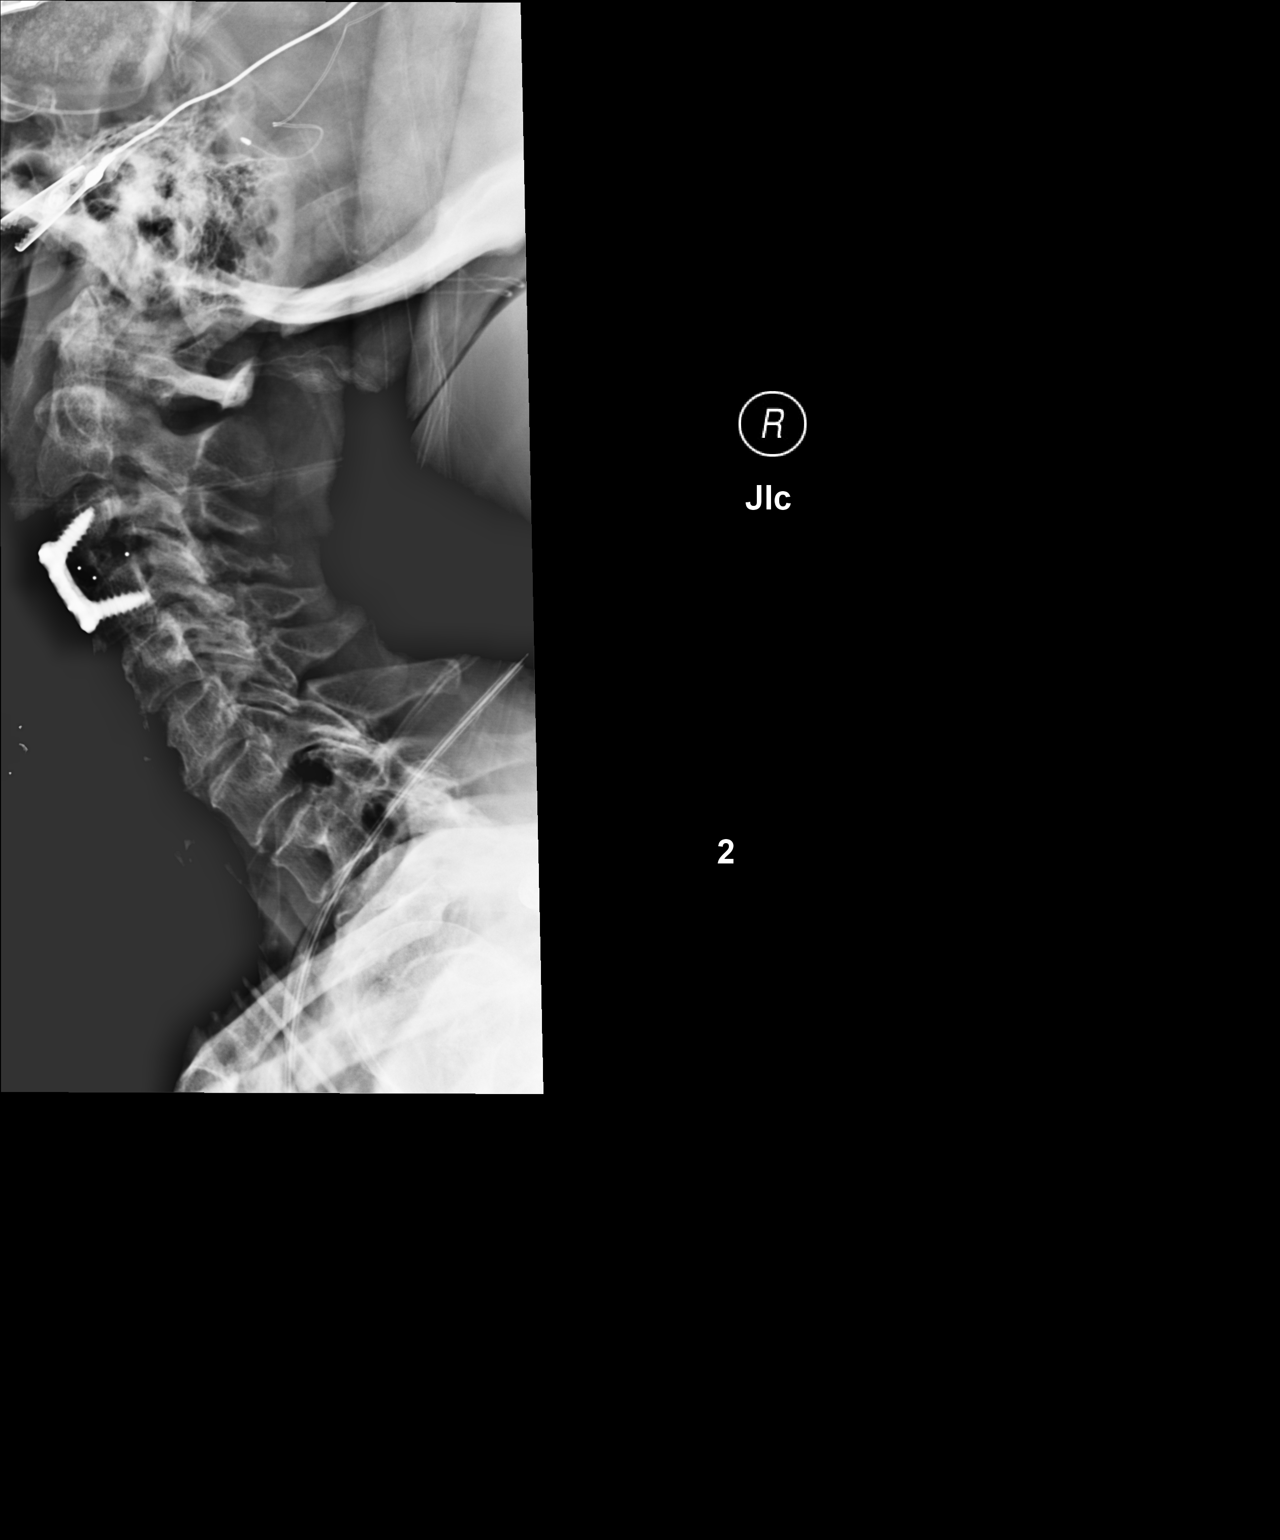

[2 of 2 positions shown; findings below may reference images not displayed]

FINDINGS: Two cross-table intraoperative radiographs of the cervical spine are
provided. On the first image, the tip of a bent needle projects over
the C4-5 disc space. The second image demonstrates performance of
C3-4 ACDF with an anterior plate, screws, and interbody implant in
place. An endotracheal tube is in place.
IMPRESSION: Intraoperative radiographs during C3-4 ACDF.

## 2023-02-04 ENCOUNTER — Ambulatory Visit: Payer: Medicare Other | Admitting: Student

## 2023-04-14 ENCOUNTER — Other Ambulatory Visit: Payer: Self-pay | Admitting: Cardiology
